# Patient Record
Sex: Female | Born: 1950 | Race: White | Hispanic: No | Marital: Single | Smoking: Never smoker
Health system: Southern US, Community
[De-identification: ages and names within clinical notes are randomized; demographics above are authoritative.]

## PROBLEM LIST (undated history)

## (undated) DIAGNOSIS — F419 Anxiety disorder, unspecified: Secondary | ICD-10-CM

## (undated) DIAGNOSIS — G43909 Migraine, unspecified, not intractable, without status migrainosus: Secondary | ICD-10-CM

## (undated) DIAGNOSIS — F32A Depression, unspecified: Secondary | ICD-10-CM

## (undated) DIAGNOSIS — F329 Major depressive disorder, single episode, unspecified: Secondary | ICD-10-CM

## (undated) DIAGNOSIS — K649 Unspecified hemorrhoids: Secondary | ICD-10-CM

## (undated) DIAGNOSIS — Z8781 Personal history of (healed) traumatic fracture: Secondary | ICD-10-CM

## (undated) DIAGNOSIS — T560X1A Toxic effect of lead and its compounds, accidental (unintentional), initial encounter: Secondary | ICD-10-CM

## (undated) DIAGNOSIS — Z8739 Personal history of other diseases of the musculoskeletal system and connective tissue: Secondary | ICD-10-CM

## (undated) DIAGNOSIS — D649 Anemia, unspecified: Secondary | ICD-10-CM

## (undated) HISTORY — DX: Depression, unspecified: F32.A

## (undated) HISTORY — DX: Anxiety disorder, unspecified: F41.9

## (undated) HISTORY — DX: Personal history of (healed) traumatic fracture: Z87.81

## (undated) HISTORY — DX: Migraine, unspecified, not intractable, without status migrainosus: G43.909

## (undated) HISTORY — DX: Personal history of other diseases of the musculoskeletal system and connective tissue: Z87.39

## (undated) HISTORY — DX: Unspecified hemorrhoids: K64.9

## (undated) HISTORY — DX: Major depressive disorder, single episode, unspecified: F32.9

---

## 2017-03-27 ENCOUNTER — Encounter: Payer: Self-pay | Admitting: *Deleted

## 2017-03-27 ENCOUNTER — Observation Stay
Admission: EM | Admit: 2017-03-27 | Discharge: 2017-03-28 | Disposition: A | Discharge status: Home / Self Care | Payer: Medicare Other | Attending: Internal Medicine | Admitting: Internal Medicine

## 2017-03-27 ENCOUNTER — Observation Stay: Payer: Medicare Other

## 2017-03-27 DIAGNOSIS — K5730 Diverticulosis of large intestine without perforation or abscess without bleeding: Secondary | ICD-10-CM | POA: Diagnosis not present

## 2017-03-27 DIAGNOSIS — Z77011 Contact with and (suspected) exposure to lead: Secondary | ICD-10-CM

## 2017-03-27 DIAGNOSIS — D649 Anemia, unspecified: Secondary | ICD-10-CM | POA: Diagnosis present

## 2017-03-27 DIAGNOSIS — E538 Deficiency of other specified B group vitamins: Secondary | ICD-10-CM | POA: Diagnosis not present

## 2017-03-27 DIAGNOSIS — J189 Pneumonia, unspecified organism: Secondary | ICD-10-CM | POA: Diagnosis not present

## 2017-03-27 DIAGNOSIS — R112 Nausea with vomiting, unspecified: Secondary | ICD-10-CM | POA: Diagnosis not present

## 2017-03-27 DIAGNOSIS — Z79899 Other long term (current) drug therapy: Secondary | ICD-10-CM | POA: Diagnosis not present

## 2017-03-27 DIAGNOSIS — M7989 Other specified soft tissue disorders: Secondary | ICD-10-CM

## 2017-03-27 DIAGNOSIS — R079 Chest pain, unspecified: Secondary | ICD-10-CM | POA: Diagnosis not present

## 2017-03-27 DIAGNOSIS — R0602 Shortness of breath: Secondary | ICD-10-CM | POA: Insufficient documentation

## 2017-03-27 DIAGNOSIS — D509 Iron deficiency anemia, unspecified: Secondary | ICD-10-CM | POA: Diagnosis not present

## 2017-03-27 HISTORY — DX: Anemia, unspecified: D64.9

## 2017-03-27 HISTORY — DX: Toxic effect of lead and its compounds, accidental (unintentional), initial encounter: T56.0X1A

## 2017-03-27 LAB — CBC
HEMATOCRIT: 19.9 % — AB (ref 35.0–47.0)
Hemoglobin: 5.7 g/dL — ABNORMAL LOW (ref 12.0–16.0)
MCH: 15.7 pg — ABNORMAL LOW (ref 26.0–34.0)
MCHC: 28.5 g/dL — ABNORMAL LOW (ref 32.0–36.0)
MCV: 55 fL — ABNORMAL LOW (ref 80.0–100.0)
Platelets: 462 10*3/uL — ABNORMAL HIGH (ref 150–440)
RBC: 3.62 MIL/uL — AB (ref 3.80–5.20)
RDW: 21.1 % — ABNORMAL HIGH (ref 11.5–14.5)
WBC: 3.4 10*3/uL — AB (ref 3.6–11.0)

## 2017-03-27 LAB — COMPREHENSIVE METABOLIC PANEL
ALK PHOS: 73 U/L (ref 38–126)
ALT: 19 U/L (ref 14–54)
AST: 32 U/L (ref 15–41)
Albumin: 3.5 g/dL (ref 3.5–5.0)
Anion gap: 5 (ref 5–15)
BILIRUBIN TOTAL: 0.5 mg/dL (ref 0.3–1.2)
BUN: 14 mg/dL (ref 6–20)
CALCIUM: 8.6 mg/dL — AB (ref 8.9–10.3)
CO2: 22 mmol/L (ref 22–32)
CREATININE: 0.67 mg/dL (ref 0.44–1.00)
Chloride: 109 mmol/L (ref 101–111)
GFR calc Af Amer: >60 mL/min (ref >60)
Glucose, Bld: 90 mg/dL (ref 65–99)
Potassium: 3.6 mmol/L (ref 3.5–5.1)
Sodium: 136 mmol/L (ref 135–145)
TOTAL PROTEIN: 6.6 g/dL (ref 6.5–8.1)

## 2017-03-27 LAB — RETICULOCYTES
RBC.: 3.59 MIL/uL — ABNORMAL LOW (ref 3.80–5.20)
Retic Count, Absolute: 53.9 10*3/uL (ref 19.0–183.0)
Retic Ct Pct: 1.5 % (ref 0.4–3.1)

## 2017-03-27 LAB — IRON AND TIBC
Iron: 9 ug/dL — ABNORMAL LOW (ref 28–170)
Saturation Ratios: 2 % — ABNORMAL LOW (ref 10.4–31.8)
TIBC: 481 ug/dL — ABNORMAL HIGH (ref 250–450)
UIBC: 473 ug/dL

## 2017-03-27 LAB — URINALYSIS, DIPSTICK ONLY
Bilirubin Urine: NEGATIVE
Glucose, UA: NEGATIVE mg/dL
Hgb urine dipstick: NEGATIVE
Ketones, ur: NEGATIVE mg/dL
LEUKOCYTES UA: NEGATIVE
Nitrite: NEGATIVE
Protein, ur: NEGATIVE mg/dL
Specific Gravity, Urine: 1.017 (ref 1.005–1.030)
pH: 6 (ref 5.0–8.0)

## 2017-03-27 LAB — APTT: aPTT: 30 seconds (ref 24–36)

## 2017-03-27 LAB — PREPARE RBC (CROSSMATCH)

## 2017-03-27 LAB — TROPONIN I

## 2017-03-27 LAB — FERRITIN: FERRITIN: 3 ng/mL — AB (ref 11–307)

## 2017-03-27 LAB — FOLATE: Folate: 18.7 ng/mL (ref >5.9)

## 2017-03-27 LAB — ABO/RH: ABO/RH(D): AB POS

## 2017-03-27 LAB — HEMOGLOBIN AND HEMATOCRIT, BLOOD
HEMATOCRIT: 29.8 % — AB (ref 35.0–47.0)
HEMOGLOBIN: 9.2 g/dL — AB (ref 12.0–16.0)

## 2017-03-27 LAB — LACTATE DEHYDROGENASE: LDH: 150 U/L (ref 98–192)

## 2017-03-27 LAB — PROTIME-INR
INR: 1.05
PROTHROMBIN TIME: 13.7 s (ref 11.4–15.2)

## 2017-03-27 LAB — VITAMIN B12: Vitamin B-12: 241 pg/mL (ref 180–914)

## 2017-03-27 MED ORDER — DOCUSATE SODIUM 100 MG PO CAPS: 100.0000 mg | ORAL_CAPSULE | Freq: Two times a day (BID) | ORAL | Status: DC | PRN

## 2017-03-27 MED ORDER — AMOXICILLIN-POT CLAVULANATE 875-125 MG PO TABS
1.0000 | ORAL_TABLET | Freq: Two times a day (BID) | ORAL | Status: DC
  Administered 2017-03-27 – 2017-03-28 (×2): 1 via ORAL
  Filled 2017-03-27 (×2): qty 1

## 2017-03-27 MED ORDER — HEPARIN SODIUM (PORCINE) 5000 UNIT/ML IJ SOLN
5000.0000 [IU] | Freq: Three times a day (TID) | INTRAMUSCULAR | Status: DC
  Filled 2017-03-27: qty 1

## 2017-03-27 MED ORDER — BENZONATATE 100 MG PO CAPS
100.0000 mg | ORAL_CAPSULE | Freq: Three times a day (TID) | ORAL | Status: DC | PRN
  Administered 2017-03-27: 100 mg via ORAL

## 2017-03-27 MED ORDER — IOPAMIDOL (ISOVUE-300) INJECTION 61%
100.0000 mL | Freq: Once | INTRAVENOUS | Status: AC | PRN
  Administered 2017-03-28: 100 mL via INTRAVENOUS

## 2017-03-27 MED ORDER — SODIUM CHLORIDE 0.9 % IV SOLN
10.0000 mL/h | Freq: Once | INTRAVENOUS | Status: AC
  Administered 2017-03-27: 10 mL/h via INTRAVENOUS

## 2017-03-27 MED ORDER — IOPAMIDOL (ISOVUE-300) INJECTION 61%
15.0000 mL | INTRAVENOUS | Status: AC
  Administered 2017-03-27 (×2): 15 mL via ORAL

## 2017-03-27 MED ORDER — AMOXICILLIN 500 MG PO CAPS
500.0000 mg | ORAL_CAPSULE | Freq: Two times a day (BID) | ORAL | Status: DC
  Filled 2017-03-27: qty 1

## 2017-03-27 MED ORDER — IBUPROFEN 400 MG PO TABS
400.0000 mg | ORAL_TABLET | Freq: Every day | ORAL | Status: DC | PRN
  Administered 2017-03-27: 400 mg via ORAL
  Filled 2017-03-27: qty 1

## 2017-03-27 NOTE — ED Provider Notes (Signed)
[facility] Emergency Department Provider Note   ____________________________________________    I have reviewed the triage vital signs and the nursing notes.   HISTORY  Chief Complaint Abnormal Lab     HPI Nils FlackMartha Barnhart is a 66 y.o. female who was sent in by her PCP for a low hemoglobin. Patient reports she had blood drawn several days ago and reports she was notified by her PCP that it was quite low, she really had a fingerstick recheck in the office and it was found to be 4.2. She reports shortness of breath with any exertion and occasional dizziness. She denies chest pain. No abdominal pain. No black stools. No history of GI bleeding. No blood thinners.   History reviewed. No pertinent past medical history.  There are no active problems to display for this patient.   History reviewed. No pertinent surgical history.  Prior to Admission medications   Not on File     Allergies Patient has no known allergies.  No family history on file.  Social History Social History  Substance Use Topics  . Smoking status: Never Smoker  . Smokeless tobacco: Never Used  . Alcohol use Not on file    Review of Systems  Constitutional: No fever/chills, Occasional dizziness Eyes: No visual changes.  ENT: No sore throat. Cardiovascular: Denies chest pain. Respiratory: As above Gastrointestinal: No abdominal pain.  No nausea, no vomiting.   Genitourinary: Negative for dysuria. Musculoskeletal: Negative for back pain. Skin: Negative for rash. Neurological: Negative for headaches    ____________________________________________   PHYSICAL EXAM:  VITAL SIGNS: ED Triage Vitals  Enc Vitals Group     BP 03/27/17 0821 (!) 138/91     Pulse Rate 03/27/17 0821 (!) 117     Resp 03/27/17 0821 18     Temp 03/27/17 0821 98.5 F (36.9 C)     Temp Source 03/27/17 0821 Oral     SpO2 03/27/17 0821 100 %     Weight 03/27/17 0821 59 kg (130 lb)     Height  03/27/17 0821 1.499 m (4\' 11" )     Head Circumference --      Peak Flow --      Pain Score 03/27/17 0853 5     Pain Loc --      Pain Edu? --      Excl. in GC? --     Constitutional: Alert and oriented. No acute distress. Pleasant and interactive Eyes: Conjunctivae are normal. Some conjunctival Pallor noted  Nose: No congestion/rhinnorhea. Mouth/Throat: Mucous membranes are moist.   Neck:  Painless ROM Cardiovascular: Tachycardia, regular rhythm. Grossly normal heart sounds.  Good peripheral circulation. Respiratory: Normal respiratory effort.  No retractions. Lungs CTAB. Gastrointestinal: Soft and nontender. No distention.  No CVA tenderness. Guaiac negative brown stool Genitourinary: deferred Musculoskeletal: No lower extremity tenderness nor edema.  Warm and well perfused Neurologic:  Normal speech and language. No gross focal neurologic deficits are appreciated.  Skin:  Skin is warm, dry and intact. Pallor noted Psychiatric: Mood and affect are normal. Speech and behavior are normal.  ____________________________________________   LABS (all labs ordered are listed, but only abnormal results are displayed)  Labs Reviewed  CBC - Abnormal; Notable for the following:       Result Value   WBC 3.4 (*)    RBC 3.62 (*)    Hemoglobin 5.7 (*)    HCT 19.9 (*)    MCV 55.0 (*)    MCH 15.7 (*)  MCHC 28.5 (*)    RDW 21.1 (*)    Platelets 462 (*)    All other components within normal limits  COMPREHENSIVE METABOLIC PANEL - Abnormal; Notable for the following:    Calcium 8.6 (*)    All other components within normal limits  PROTIME-INR  APTT  TROPONIN I  TYPE AND SCREEN  PREPARE RBC (CROSSMATCH)  ABO/RH   ____________________________________________  EKG  None ____________________________________________  RADIOLOGY  None ____________________________________________   PROCEDURES  Procedure(s) performed: No    Critical Care performed:  No ____________________________________________   INITIAL IMPRESSION / ASSESSMENT AND PLAN / ED COURSE  Pertinent labs & imaging results that were available during my care of the patient were reviewed by me and considered in my medical decision making (see chart for details).  Patient presents with shortness of breath with exertion, pallor and reported low hemoglobin . Hemoglobin here is 5.7, guaiac negative brown stool. Quite low MCV, patient reports she has been on iron supplements in the past but stopped about 7-8 months ago. I suspect this is severe iron deficiency anemia, given her symptoms she will require admission ____________________________________________   FINAL CLINICAL IMPRESSION(S) / ED DIAGNOSES  Final diagnoses:  Symptomatic anemia      NEW MEDICATIONS STARTED DURING THIS VISIT:  New Prescriptions   No medications on file     Note:  This document was prepared using Dragon voice recognition software and may include unintentional dictation errors.    Jene Every, MD 03/27/17 1027

## 2017-03-27 NOTE — H&P (Addendum)
[employer] [facility] -  at [facility]   PATIENT NAME: Sonya Baker    MR#:  [id number]  DATE OF BIRTH:  [birth year]  DATE OF ADMISSION:  03/27/2017  PRIMARY CARE PHYSICIAN: Lovey Newcomer, MD   REQUESTING/REFERRING PHYSICIAN: [email or web address]  CHIEF COMPLAINT:   Chief Complaint  Patient presents with  . Abnormal Lab    HISTORY OF PRESENT ILLNESS: Sonya Baker  is a 66 y.o. female with a known history of Lead poisoning and iron deficiency anemia, stopped taking vitamins and iron supplements since December 2017. For last 3-4 weeks is feeling more fatigue. 2 days ago she went to walk-in clinic and her doctor did the workup including blood work and chest x-ray for her complain of some cough and suggested that she has pneumonia and gave her some antibiotics. Her blood work reported today that she has severe anemia and so her doctor's office called and told her to go to emergency room to get some blood transfusion. Patient denies noticing any bleeding or dark stool, in ER stool guaiac was reported negative for any blood. ER physician ordered 2 units of blood transfusions.  PAST MEDICAL HISTORY:   Past Medical History:  Diagnosis Date  . Anemia   . Lead poisoning     PAST SURGICAL HISTORY: History reviewed. No pertinent surgical history.  SOCIAL HISTORY:  Social History  Substance Use Topics  . Smoking status: Never Smoker  . Smokeless tobacco: Never Used  . Alcohol use No    FAMILY HISTORY:  Family History  Problem Relation Age of Onset  . Heart failure Father     DRUG ALLERGIES: No Known Allergies  REVIEW OF SYSTEMS:   CONSTITUTIONAL: No fever,Positive for fatigue or weakness.  EYES: No blurred or double vision.  EARS, NOSE, AND THROAT: No tinnitus or ear pain.  RESPIRATORY: No cough, shortness of breath, wheezing or hemoptysis.  CARDIOVASCULAR: No chest pain, orthopnea, edema.  GASTROINTESTINAL: No nausea, vomiting, diarrhea or abdominal pain.   GENITOURINARY: No dysuria, hematuria.  ENDOCRINE: No polyuria, nocturia,  HEMATOLOGY: No anemia, easy bruising or bleeding SKIN: No rash or lesion. MUSCULOSKELETAL: No joint pain or arthritis.   NEUROLOGIC: No tingling, numbness, weakness.  PSYCHIATRY: No anxiety or depression.   MEDICATIONS AT HOME:  Prior to Admission medications   Medication Sig Start Date End Date Taking? Authorizing Provider  amoxicillin (AMOXIL) 500 MG tablet Take 500 mg by mouth 2 (two) times daily. 03/22/17 03/31/17 Yes [provider]  benzonatate (TESSALON) 100 MG capsule Take 100 mg by mouth 3 (three) times daily as needed for cough.   Yes [provider]  ibuprofen (ADVIL,MOTRIN) 200 MG tablet Take 200-800 mg by mouth daily as needed for headache.   Yes [provider]      PHYSICAL EXAMINATION:   VITAL SIGNS: Blood pressure (!) 152/105, pulse (!) 108, temperature 98.5 F (36.9 C), temperature source Oral, resp. rate (!) 23, height 4\' 11"  (1.499 m), weight 59 kg (130 lb), SpO2 100 %.  GENERAL:  66 y.o.-year-old patient lying in the bed with no acute distress.  EYES: Pupils equal, round, reactive to light and accommodation. No scleral icterus. Extraocular muscles intact.Conjunctiva pale.  HEENT: Head atraumatic, normocephalic. Oropharynx and nasopharynx clear.  NECK:  Supple, no jugular venous distention. No thyroid enlargement, no tenderness.  LUNGS: Normal breath sounds bilaterally, no wheezing, rales,rhonchi or crepitation. No use of accessory muscles of respiration.  CARDIOVASCULAR: S1, S2 normal. No murmurs, rubs, or gallops.  ABDOMEN: Soft, nontender, nondistended.  Bowel sounds present. No organomegaly or mass.  EXTREMITIES: No pedal edema, cyanosis, or clubbing.  NEUROLOGIC: Cranial nerves II through XII are intact. Muscle strength 5/5 in all extremities. Sensation intact. Gait not checked.  PSYCHIATRIC: The patient is alert and oriented x 3.  SKIN: No obvious rash, lesion,  or ulcer.   LABORATORY PANEL:   CBC  Recent Labs Lab 03/27/17 0835  WBC 3.4*  HGB 5.7*  HCT 19.9*  PLT 462*  MCV 55.0*  MCH 15.7*  MCHC 28.5*  RDW 21.1*   ------------------------------------------------------------------------------------------------------------------  Chemistries   Recent Labs Lab 03/27/17 0835  NA 136  K 3.6  CL 109  CO2 22  GLUCOSE 90  BUN 14  CREATININE 0.67  CALCIUM 8.6*  AST 32  ALT 19  ALKPHOS 73  BILITOT 0.5   ------------------------------------------------------------------------------------------------------------------ estimated creatinine clearance is 54.8 mL/min (by C-G formula based on SCr of 0.67 mg/dL). ------------------------------------------------------------------------------------------------------------------ No results for input(s): TSH, T4TOTAL, T3FREE, THYROIDAB in the last 72 hours.  Invalid input(s): FREET3   Coagulation profile  Recent Labs Lab 03/27/17 0835  INR 1.05   ------------------------------------------------------------------------------------------------------------------- No results for input(s): DDIMER in the last 72 hours. -------------------------------------------------------------------------------------------------------------------  Cardiac Enzymes  Recent Labs Lab 03/27/17 0835  TROPONINI <0.03   ------------------------------------------------------------------------------------------------------------------ Invalid input(s): POCBNP  ---------------------------------------------------------------------------------------------------------------  Urinalysis No results found for: COLORURINE, APPEARANCEUR, LABSPEC, PHURINE, GLUCOSEU, HGBUR, BILIRUBINUR, KETONESUR, PROTEINUR, UROBILINOGEN, NITRITE, LEUKOCYTESUR   RADIOLOGY: No results found.  EKG: Orders placed or performed during the hospital encounter of 03/27/17  . ED EKG  . ED EKG    IMPRESSION AND PLAN:  *  Symptomatic anemia likely iron deficiency anemia   Patient has history of lead poisoning in past.    I spoke to hematologist on call and suggested the workup to check for iron studies, haptoglobin, LDH, reticulocyte count, folic acid level, vitamin B12 level.   2 units of blood transfusion is ordered by ER physician.  * History of lead poisoning   I will suggest to refer to hematologist on discharge.  * Tachycardia   Likely secondary to anemia.  * pneumonia   She was started on amoxicilline as out pt, feeling better, cont for 3-4 more days.  All the records are reviewed and case discussed with ED provider. Management plans discussed with the patient, family and they are in agreement.  CODE STATUS: Full code. Code Status History    This patient does not have a recorded code status. Please follow your organizational policy for patients in this situation.     Patient's daughter was present in the room during my visit.  TOTAL TIME TAKING CARE OF THIS PATIENT: 50 minutes.    Altamese DillingVACHHANI, Gal Smolinski M.D on 03/27/2017   Between 7am to 6pm - Pager - [phone]  After 6pm go to [email or web address] - password [email or web address]  [facility]  Office  [phone]  CC: Primary care physician; Lovey NewcomerWroth, Shelley Wilson, MD   Note: This dictation was prepared with Dragon dictation along with smaller phrase technology. Any transcriptional errors that result from this process are unintentional.

## 2017-03-27 NOTE — ED Triage Notes (Signed)
Pt reports [facility] and having labs drawn, was called and told to come to ED due to hemoglobin being 4.2.

## 2017-03-27 NOTE — ED Triage Notes (Signed)
Pt states she was called from PCP and told she had low hgb, denies any blood in her stool, states she is currently being tx for peneumonia as well

## 2017-03-27 NOTE — Consult Note (Signed)
[facility] CONSULT NOTE  Patient Care Team: Wroth, Alvia Grove, MD as PCP - General (Obstetrics and Gynecology)  CHIEF COMPLAINTS/PURPOSE OF CONSULTATION: Severe anemia  HISTORY OF PRESENTING ILLNESS:  Sonya Baker 66 y.o.  female with no significant medical problems except previous history of lead poisoning- is currently admitted to the hospital for severe anemia.  Patient stated that she noted to have shortness of breath; left chest wall pain with cough and low-grade fever over the last week or so. She was evaluated in the urgent care Center- chest x-ray showed pneumonia. Patient was started on antibiotics. Patient also had labs drawn that showed severe anemia with hemoglobin of 4.7- 5.4. Then she was directed to the hospital for further workup and PRBC transfusion.  Patient admits to have intermittent nausea with vomiting; however denies any current weight loss. Denies any dysphagia. Appetite is stable. Interestingly she denies any chronic shortness of breath. She has intermittent swelling in the legs for which she takes Lasix on an intermittent basis. Denies any blood in stools or black stools. Denies any blood in urine.  Interestingly patient has a history of lead poisoning while scraping of the lead based pain of the wall in 2009. As the patient that she was told to have anemia for which she had been taking iron and B12 supplementation for many years. She stopped approximately a year ago. Patient never had colonoscopy. He is having EGD.  ROS: A complete 10 point review of system is done which is negative except mentioned above in history of present illness  MEDICAL HISTORY:  Past Medical History:  Diagnosis Date  . Anemia   . Lead poisoning     SURGICAL HISTORY: Some kind of bowel surgery at the age of 41; for bowel obstruction  History reviewed. No pertinent surgical history.  SOCIAL HISTORY: Patient works in a factory. She lives with her daughter. She is  independent. No smoking or alcohol. Social History   Social History  . Marital status: Single    Spouse name: N/A  . Number of children: N/A  . Years of education: N/A   Occupational History  . Not on file.   Social History Main Topics  . Smoking status: Never Smoker  . Smokeless tobacco: Never Used  . Alcohol use No  . Drug use: No  . Sexual activity: Not on file   Other Topics Concern  . Not on file   Social History Narrative  . No narrative on file    FAMILY HISTORY:Denies any family history of cancers. Family History  Problem Relation Age of Onset  . Heart failure Father     ALLERGIES:  has No Known Allergies.  MEDICATIONS:  Current Facility-Administered Medications  Medication Dose Route Frequency Provider Last Rate Last Dose  . amoxicillin-clavulanate (AUGMENTIN) 875-125 MG per tablet 1 tablet  1 tablet Oral Q12H Altamese Dilling, MD   1 tablet at 03/27/17 1858  . benzonatate (TESSALON) capsule 100 mg  100 mg Oral TID PRN Altamese Dilling, MD   100 mg at 03/27/17 1653  . docusate sodium (COLACE) capsule 100 mg  100 mg Oral BID PRN Altamese Dilling, MD      . heparin injection 5,000 Units  5,000 Units Subcutaneous Q8H Altamese Dilling, MD      . ibuprofen (ADVIL,MOTRIN) tablet 400 mg  400 mg Oral Daily PRN Altamese Dilling, MD          .  PHYSICAL EXAMINATION:  Vitals:   03/27/17 1645 03/27/17 1658  BP: 138/79 (!) 150/96  Pulse: (!) 104 (!) 110  Resp: 18 18  Temp: 99 F (37.2 C) 99 F (37.2 C)   Filed Weights   03/27/17 0821 03/27/17 1459  Weight: 130 lb (59 kg) 132 lb 8 oz (60.1 kg)    GENERAL: Well-nourished well-developed; Alert, no distress and comfortable.   Accompanied by daughter. EYES: Positive for pallor nor icterus. OROPHARYNX: no thrush or ulceration. NECK: supple, no masses felt LYMPH:  no palpable lymphadenopathy in the cervical, axillary or inguinal regions LUNGS: decreased breath sounds to  auscultation at bases and  No wheeze or crackles HEART/CVS: regular rate & rhythm and no murmurs; No lower extremity edema ABDOMEN: abdomen soft, non-tender and normal bowel sounds Musculoskeletal:no cyanosis of digits and no clubbing  PSYCH: alert & oriented x 3 with fluent speech NEURO: no focal motor/sensory deficits SKIN:  no rashes or significant lesions  LABORATORY DATA:  I have reviewed the data as listed Lab Results  Component Value Date   WBC 3.4 (L) 03/27/2017   HGB 5.7 (L) 03/27/2017   HCT 19.9 (L) 03/27/2017   MCV 55.0 (L) 03/27/2017   PLT 462 (H) 03/27/2017    Recent Labs  03/27/17 0835  NA 136  K 3.6  CL 109  CO2 22  GLUCOSE 90  BUN 14  CREATININE 0.67  CALCIUM 8.6*  GFRNONAA >60  GFRAA >60  PROT 6.6  ALBUMIN 3.5  AST 32  ALT 19  ALKPHOS 73  BILITOT 0.5    RADIOGRAPHIC STUDIES: I have personally reviewed the radiological images as listed and agreed with the findings in the report. No results found.  ASSESSMENT & PLAN:   # 66 year old female patient currently admitted the hospital for severe microcytic anemia  # Severe microcytic anemia hemoglobin 4.7-5. Iron saturation 2% ferritin 3. Patient receiving blood transfusion at this time. Patient will also benefit from IV iron transfusions.  # Etiology of iron deficiency anemia is unclear- chronic blood loss versus malabsorption. Check stool occult 2; UA. I also recommend CT of the chest abdomen pelvis with contrast to rule out any malignant causes for the severe anemia. LDH normal; haptoglobin pending. Also discussed the need for a EGD colonoscopy based upon above workup an outpatient basis.  # History of lead poisoning- I do not suspect this to be a factor in current severe iron deficiency anemia.   Thank you Dr. Elisabeth PigeonVachhani for allowing me to participate in the care of your pleasant patient. Please do not hesitate to contact me with questions or concerns in the interim. Discussed with Dr.Vachhani. The  above plan of care was discussed with the patient and daughter in detail.  All questions were answered. The patient knows to call the clinic with any problems, questions or concerns.    Earna CoderGovinda R Sakari Raisanen, MD 03/27/2017 7:29 PM

## 2017-03-28 LAB — BASIC METABOLIC PANEL
Anion gap: 5 (ref 5–15)
BUN: 17 mg/dL (ref 6–20)
CHLORIDE: 108 mmol/L (ref 101–111)
CO2: 24 mmol/L (ref 22–32)
Calcium: 8.5 mg/dL — ABNORMAL LOW (ref 8.9–10.3)
Creatinine, Ser: 0.84 mg/dL (ref 0.44–1.00)
GFR calc Af Amer: >60 mL/min (ref >60)
GFR calc non Af Amer: >60 mL/min (ref >60)
Glucose, Bld: 88 mg/dL (ref 65–99)
POTASSIUM: 3.6 mmol/L (ref 3.5–5.1)
Sodium: 137 mmol/L (ref 135–145)

## 2017-03-28 LAB — BPAM RBC
BLOOD PRODUCT EXPIRATION DATE: 201807112359
BLOOD PRODUCT EXPIRATION DATE: 201807112359
ISSUE DATE / TIME: 201806271123
ISSUE DATE / TIME: 201806271625
UNIT TYPE AND RH: 5100
Unit Type and Rh: 5100

## 2017-03-28 LAB — CBC
HEMATOCRIT: 26.9 % — AB (ref 35.0–47.0)
Hemoglobin: 8.6 g/dL — ABNORMAL LOW (ref 12.0–16.0)
MCH: 19.8 pg — ABNORMAL LOW (ref 26.0–34.0)
MCHC: 32.1 g/dL (ref 32.0–36.0)
MCV: 61.8 fL — AB (ref 80.0–100.0)
Platelets: 375 10*3/uL (ref 150–440)
RBC: 4.36 MIL/uL (ref 3.80–5.20)
RDW: 30.2 % — AB (ref 11.5–14.5)
WBC: 6.2 10*3/uL (ref 3.6–11.0)

## 2017-03-28 LAB — TYPE AND SCREEN
ABO/RH(D): AB POS
ANTIBODY SCREEN: NEGATIVE
UNIT DIVISION: 0
Unit division: 0

## 2017-03-28 LAB — HAPTOGLOBIN: Haptoglobin: 68 mg/dL (ref 34–200)

## 2017-03-28 LAB — PATHOLOGIST SMEAR REVIEW

## 2017-03-28 MED ORDER — SODIUM CHLORIDE 0.9 % IV SOLN
150.0000 mg | Freq: Once | INTRAVENOUS | Status: AC
  Administered 2017-03-28: 150 mg via INTRAVENOUS
  Filled 2017-03-28: qty 7.5

## 2017-03-28 MED ORDER — FERROUS SULFATE 325 (65 FE) MG PO TABS: 325.0000 mg | ORAL_TABLET | Freq: Every day | ORAL | 3 refills | Status: AC

## 2017-03-28 NOTE — Progress Notes (Signed)
Discharge instructions along with home medications and follow up gone over with patient and daughter. Both verbalize that they understood instructions. No prescriptions given to patient. IV removed. Pt being discharged home on room air, no distress noted. Issachar Broady S Fenton, RN 

## 2017-03-28 NOTE — Care Management Obs Status (Signed)
MEDICARE OBSERVATION STATUS NOTIFICATION   Patient Details  Name: Sonya Baker MRN: [id number] Date of Birth: 12/07/1950   Medicare Observation Status Notification Given:  Yes    Gwenette GreetBrenda S Clarkson Rosselli, RN 03/28/2017, 9:03 AM

## 2017-03-28 NOTE — Discharge Summary (Signed)
Nils FlackMartha Jahr, is a 66 y.o. female  DOB 08/16/1951  MRN [id number].  Admission date:  03/27/2017  Admitting Physician  Altamese DillingVaibhavkumar Vachhani, MD  Discharge Date:  03/28/2017   Primary MD  Lovey NewcomerWroth, Shelley Wilson, MD  Recommendations for primary care physician for things to follow:  Discharge  home, follow-up with Dr. Donneta RombergBrahmanday  in 2 weeks Made the appointment to follow up with Dr. Wyline MoodKiran Anna  for EGD and colonoscopy.   Admission Diagnosis  Symptomatic anemia [D64.9]   Discharge Diagnosis  Symptomatic anemia [D64.9]    Principal Problem:   Symptomatic anemia      Past Medical History:  Diagnosis Date  . Anemia   . Lead poisoning     History reviewed. No pertinent surgical history.     History of present illness and  Hospital Course:     Kindly see H&P for history of present illness and admission details, please review complete Labs, Consult reports and Test reports for all details in brief  HPI  from the history and physical done on the day of admission 66 year old female patient with history of recent pneumonia on antibiotics had routine labs done. Found to have hemoglobin of 5.7. So she was sent to emergency room for transfusion and workup for anemia.   Hospital Course   1 . Severe  microcytic anemia hemoglobin 5.7 on admission. Received 2 units of packed RBC. Hemoglobin improved to 5.7-9.2. Iron studies showed severe iron deficiency anemia with iron level of 9, saturation is 2%. 18.7. Ferritin is 3. B12 241. Seen by oncology. Patient did have chest CAT scan and abdominal CAT scan which are not diagnostic. Patient is stable for discharge, patient needs outpatient EGD, colonoscopy. We are giving her on 50 mg of IV Venofer and discharging her home with ferrous sulfate.    Discharge Condition: stable   Follow  UP  Follow-up Information    Wroth, Alvia GroveShelley Wilson, MD. Schedule an appointment as soon as possible for a visit in 1 week.   Specialties:  Obstetrics and Gynecology Contact information: [address] [place] [address] [phone]        Wyline MoodAnna, Kiran, MD. Go on 04/11/2017.   Specialty:  Surgery Why:  @1 :15 PM // needs egd/colonosocpy for  iron deficinecy anemia Contact information: [address] [place] [address] [phone]        Earna CoderBrahmanday, Govinda R, MD. Go on 04/09/2017.   Specialties:  Internal Medicine, Oncology Why:  @10 :00 AM Contact information: [address] [place] [address] [phone]             Discharge Instructions  and  Discharge Medications      Allergies as of 03/28/2017   No Known Allergies     Medication List    STOP taking these medications   ibuprofen 200 MG tablet Commonly known as:  ADVIL,MOTRIN     TAKE these medications   amoxicillin-clavulanate 875-125 MG tablet Commonly known as:  AUGMENTIN Take 1 tablet by mouth 2 (two) times daily.   benzonatate 100 MG capsule Commonly known as:  TESSALON Take 100 mg by mouth 3 (three) times daily as needed for cough.   ferrous sulfate 325 (65 FE) MG tablet Take 1 tablet (325 mg total) by mouth daily.         Diet and Activity recommendation: See Discharge Instructions above   Consults obtained - hem onc   Major procedures and Radiology Reports - PLEASE review detailed and final reports  for all details, in brief -      Ct Chest W Contrast  Result Date: 03/28/2017 CLINICAL DATA:  66 year old female with history of shortness of breath and left-sided chest wall pain with cough and low-grade fever over the last week. Possible pneumonia noted on recent chest x-ray. Currently on antibiotics. Severe anemia. Intermittent nausea and vomiting. EXAM: CT CHEST, ABDOMEN, AND PELVIS WITH CONTRAST TECHNIQUE: Multidetector CT imaging of the chest, abdomen  and pelvis was performed following the standard protocol during bolus administration of intravenous contrast. CONTRAST:  ISOVUE-300 IOPAMIDOL (ISOVUE-300) INJECTION 61% COMPARISON:  None. FINDINGS: CT CHEST FINDINGS Cardiovascular: Heart size is mildly enlarged. There is no significant pericardial fluid, thickening or pericardial calcification. There is aortic atherosclerosis, as well as atherosclerosis of the great vessels of the mediastinum and the coronary arteries, including calcified atherosclerotic plaque in the right coronary artery. Mediastinum/Nodes: No pathologically enlarged mediastinal or hilar lymph nodes. Moderate-sized hiatal hernia. No axillary lymphadenopathy. Lungs/Pleura: Moderate right and small left pleural effusions lying dependently. Dependent areas of subsegmental atelectasis are noted in the lower lobes of the lungs bilaterally (right greater than left). No definite consolidative airspace disease. Diffuse bronchial wall thickening with diffuse interlobular septal thickening, suggesting a background of mild interstitial pulmonary edema. No definite suspicious appearing pulmonary nodules or masses are noted. Musculoskeletal: There are no aggressive appearing lytic or blastic lesions noted in the visualized portions of the skeleton. CT ABDOMEN PELVIS FINDINGS Hepatobiliary: Small capsular calcification overlying segment 4A of the liver is likely related to remote trauma. No suspicious cystic or solid hepatic lesions are noted. No intra or extrahepatic biliary ductal dilatation. Gallbladder is normal in appearance. Pancreas: No pancreatic mass. No pancreatic ductal dilatation. No pancreatic or peripancreatic fluid or inflammatory changes. Spleen: Unremarkable. Adrenals/Urinary Tract: 16 mm simple cyst in the lower pole of the left kidney. Subcentimeter lesion in the interpolar region of the left kidney is too small to definitively characterize, but is statistically likely to represent a  cyst. Right kidney and bilateral adrenal glands are normal in appearance. There is no hydroureteronephrosis. Urinary bladder is normal in appearance. Stomach/Bowel: The appearance of the intraabdominal portion of the stomach is normal. There is no pathologic dilatation of small bowel or colon. A few scattered colonic diverticulae are noted, without surrounding inflammatory changes to suggest an acute diverticulitis at this time. The appendix is not confidently identified and may be surgically absent. Regardless, there are no inflammatory changes noted adjacent to the cecum to suggest the presence of an acute appendicitis at this time. Vascular/Lymphatic: Aortic atherosclerosis, without evidence of aneurysm or dissection in the abdominal or pelvic vasculature. No lymphadenopathy noted in the abdomen or pelvis. Reproductive: Uterus and ovaries are unremarkable in appearance. Other: No significant volume of ascites.  No pneumoperitoneum. Musculoskeletal: Old compression fracture of superior endplate of L4 with approximately 20% loss of anterior vertebral body height. There are no aggressive appearing lytic or blastic lesions noted in the visualized portions of the skeleton. IMPRESSION: 1. The appearance of the chest suggests congestive heart failure, as discussed above. 2. No airspace consolidation identified at this time to suggest underlying pneumonia. 3. Aortic atherosclerosis, in addition to right coronary artery disease. Please note that although the presence of coronary artery calcium documents the presence of coronary artery disease, the severity of this disease and any potential stenosis cannot be assessed on this non-gated CT examination. Assessment for potential risk factor modification, dietary therapy or pharmacologic therapy may be warranted, if clinically indicated. 4. Mild  colonic diverticulosis without evidence to suggest an acute diverticulitis at this time. 5. Additional incidental findings, as above.  Electronically Signed   By: Trudie Reed M.D.   On: 03/28/2017 08:35   Ct Abdomen Pelvis W Contrast  Result Date: 03/28/2017 CLINICAL DATA:  66 year old female with history of shortness of breath and left-sided chest wall pain with cough and low-grade fever over the last week. Possible pneumonia noted on recent chest x-ray. Currently on antibiotics. Severe anemia. Intermittent nausea and vomiting. EXAM: CT CHEST, ABDOMEN, AND PELVIS WITH CONTRAST TECHNIQUE: Multidetector CT imaging of the chest, abdomen and pelvis was performed following the standard protocol during bolus administration of intravenous contrast. CONTRAST:  ISOVUE-300 IOPAMIDOL (ISOVUE-300) INJECTION 61% COMPARISON:  None. FINDINGS: CT CHEST FINDINGS Cardiovascular: Heart size is mildly enlarged. There is no significant pericardial fluid, thickening or pericardial calcification. There is aortic atherosclerosis, as well as atherosclerosis of the great vessels of the mediastinum and the coronary arteries, including calcified atherosclerotic plaque in the right coronary artery. Mediastinum/Nodes: No pathologically enlarged mediastinal or hilar lymph nodes. Moderate-sized hiatal hernia. No axillary lymphadenopathy. Lungs/Pleura: Moderate right and small left pleural effusions lying dependently. Dependent areas of subsegmental atelectasis are noted in the lower lobes of the lungs bilaterally (right greater than left). No definite consolidative airspace disease. Diffuse bronchial wall thickening with diffuse interlobular septal thickening, suggesting a background of mild interstitial pulmonary edema. No definite suspicious appearing pulmonary nodules or masses are noted. Musculoskeletal: There are no aggressive appearing lytic or blastic lesions noted in the visualized portions of the skeleton. CT ABDOMEN PELVIS FINDINGS Hepatobiliary: Small capsular calcification overlying segment 4A of the liver is likely related to remote trauma. No  suspicious cystic or solid hepatic lesions are noted. No intra or extrahepatic biliary ductal dilatation. Gallbladder is normal in appearance. Pancreas: No pancreatic mass. No pancreatic ductal dilatation. No pancreatic or peripancreatic fluid or inflammatory changes. Spleen: Unremarkable. Adrenals/Urinary Tract: 16 mm simple cyst in the lower pole of the left kidney. Subcentimeter lesion in the interpolar region of the left kidney is too small to definitively characterize, but is statistically likely to represent a cyst. Right kidney and bilateral adrenal glands are normal in appearance. There is no hydroureteronephrosis. Urinary bladder is normal in appearance. Stomach/Bowel: The appearance of the intraabdominal portion of the stomach is normal. There is no pathologic dilatation of small bowel or colon. A few scattered colonic diverticulae are noted, without surrounding inflammatory changes to suggest an acute diverticulitis at this time. The appendix is not confidently identified and may be surgically absent. Regardless, there are no inflammatory changes noted adjacent to the cecum to suggest the presence of an acute appendicitis at this time. Vascular/Lymphatic: Aortic atherosclerosis, without evidence of aneurysm or dissection in the abdominal or pelvic vasculature. No lymphadenopathy noted in the abdomen or pelvis. Reproductive: Uterus and ovaries are unremarkable in appearance. Other: No significant volume of ascites.  No pneumoperitoneum. Musculoskeletal: Old compression fracture of superior endplate of L4 with approximately 20% loss of anterior vertebral body height. There are no aggressive appearing lytic or blastic lesions noted in the visualized portions of the skeleton. IMPRESSION: 1. The appearance of the chest suggests congestive heart failure, as discussed above. 2. No airspace consolidation identified at this time to suggest underlying pneumonia. 3. Aortic atherosclerosis, in addition to right  coronary artery disease. Please note that although the presence of coronary artery calcium documents the presence of coronary artery disease, the severity of this disease and any potential stenosis cannot be  assessed on this non-gated CT examination. Assessment for potential risk factor modification, dietary therapy or pharmacologic therapy may be warranted, if clinically indicated. 4. Mild colonic diverticulosis without evidence to suggest an acute diverticulitis at this time. 5. Additional incidental findings, as above. Electronically Signed   By: Trudie Reed M.D.   On: 03/28/2017 08:35    Micro Results     No results found for this or any previous visit (from the past 240 hour(s)).     Today   Subjective:   Ariannah Arenson today has no headache,no chest abdominal pain,no new weakness tingling or numbness, feels much better wants to go home today.   Objective:   Blood pressure (!) 144/76, pulse 85, temperature 97.9 F (36.6 C), temperature source Oral, resp. rate 18, height 4\' 11"  (1.499 m), weight 60.1 kg (132 lb 8 oz), SpO2 98 %.   Intake/Output Summary (Last 24 hours) at 03/28/17 1256 Last data filed at 03/27/17 1930  Gross per 24 hour  Intake           895.83 ml  Output                0 ml  Net           895.83 ml    Exam Awake Alert, Oriented x 3, No new F.N deficits, Normal affect [place].AT,PERRAL Supple Neck,No JVD, No cervical lymphadenopathy appriciated.  Symmetrical Chest wall movement, Good air movement bilaterally, CTAB RRR,No Gallops,Rubs or new Murmurs, No Parasternal Heave +ve B.Sounds, Abd Soft, Non tender, No organomegaly appriciated, No rebound -guarding or rigidity. No Cyanosis, Clubbing or edema, No new Rash or bruise  Data Review   CBC w Diff: Lab Results  Component Value Date   WBC 6.2 03/28/2017   HGB 8.6 (L) 03/28/2017   HCT 26.9 (L) 03/28/2017   PLT 375 03/28/2017    CMP: Lab Results  Component Value Date   NA 137 03/28/2017   K 3.6  03/28/2017   CL 108 03/28/2017   CO2 24 03/28/2017   BUN 17 03/28/2017   CREATININE 0.84 03/28/2017   PROT 6.6 03/27/2017   ALBUMIN 3.5 03/27/2017   BILITOT 0.5 03/27/2017   ALKPHOS 73 03/27/2017   AST 32 03/27/2017   ALT 19 03/27/2017  .   Total Time in preparing paper work, data evaluation and todays exam - 35 minutes  Alexys Lobello M.D on 03/28/2017 at 12:56 PM    Note: This dictation was prepared with Dragon dictation along with smaller phrase technology. Any transcriptional errors that result from this process are unintentional.

## 2017-03-28 NOTE — Care Management (Signed)
Admitted to this facility with the diagnosis of symptomatic  Anemia under observation status.  Lives with daughter, Quentin OreMartha, Daughter Danielle [phone]). [facility], Butler [facility]Wroth [facility] days ago. [facility]. No home Health. No skilled facility. No home oxygen. No medical equipment in the home. Takes care of all basic and instrumental activities of daily living herself, drives. No falls. Good appetite. Daughter will transport Gwenette GreetBrenda S Erinn Huskins RN MSN CCM Care Management [phone]

## 2017-03-29 LAB — HIV ANTIBODY (ROUTINE TESTING W REFLEX): HIV Screen 4th Generation wRfx: NONREACTIVE

## 2017-04-01 ENCOUNTER — Telehealth: Payer: Self-pay | Admitting: Internal Medicine

## 2017-04-01 ENCOUNTER — Other Ambulatory Visit: Payer: Self-pay | Admitting: Internal Medicine

## 2017-04-01 DIAGNOSIS — D500 Iron deficiency anemia secondary to blood loss (chronic): Secondary | ICD-10-CM

## 2017-04-01 NOTE — Telephone Encounter (Signed)
Please add cbc/venofer infusion at next visit [7/10 at [facility]]. Thx

## 2017-04-02 ENCOUNTER — Other Ambulatory Visit: Payer: Self-pay | Admitting: *Deleted

## 2017-04-02 DIAGNOSIS — D500 Iron deficiency anemia secondary to blood loss (chronic): Secondary | ICD-10-CM

## 2017-04-02 NOTE — Telephone Encounter (Signed)
Lab orders entered- msg sent to Robin in [facility]. To add these apts to pt's sch.

## 2017-04-09 ENCOUNTER — Encounter: Payer: Self-pay | Admitting: Internal Medicine

## 2017-04-09 ENCOUNTER — Ambulatory Visit
Admission: RE | Admit: 2017-04-09 | Discharge: 2017-04-09 | Disposition: A | Discharge status: Home / Self Care | Payer: Medicare Other | Source: Ambulatory Visit | Attending: Internal Medicine | Admitting: Internal Medicine

## 2017-04-09 ENCOUNTER — Inpatient Hospital Stay: Payer: Medicare Other

## 2017-04-09 ENCOUNTER — Inpatient Hospital Stay: Payer: Medicare Other | Attending: Internal Medicine | Admitting: Internal Medicine

## 2017-04-09 ENCOUNTER — Telehealth: Payer: Self-pay | Admitting: *Deleted

## 2017-04-09 VITALS — BP 137/94 | HR 98 | Temp 97.7°F | Resp 22 | Ht 59.0 in | Wt 131.0 lb

## 2017-04-09 VITALS — BP 126/80 | HR 85 | Temp 97.0°F | Resp 18

## 2017-04-09 DIAGNOSIS — D500 Iron deficiency anemia secondary to blood loss (chronic): Secondary | ICD-10-CM

## 2017-04-09 DIAGNOSIS — F419 Anxiety disorder, unspecified: Secondary | ICD-10-CM

## 2017-04-09 DIAGNOSIS — I878 Other specified disorders of veins: Secondary | ICD-10-CM | POA: Insufficient documentation

## 2017-04-09 DIAGNOSIS — J90 Pleural effusion, not elsewhere classified: Secondary | ICD-10-CM | POA: Insufficient documentation

## 2017-04-09 DIAGNOSIS — R0989 Other specified symptoms and signs involving the circulatory and respiratory systems: Secondary | ICD-10-CM | POA: Diagnosis not present

## 2017-04-09 DIAGNOSIS — Z79899 Other long term (current) drug therapy: Secondary | ICD-10-CM | POA: Diagnosis not present

## 2017-04-09 DIAGNOSIS — F329 Major depressive disorder, single episode, unspecified: Secondary | ICD-10-CM

## 2017-04-09 DIAGNOSIS — I509 Heart failure, unspecified: Secondary | ICD-10-CM | POA: Diagnosis not present

## 2017-04-09 DIAGNOSIS — N281 Cyst of kidney, acquired: Secondary | ICD-10-CM

## 2017-04-09 DIAGNOSIS — R0600 Dyspnea, unspecified: Secondary | ICD-10-CM

## 2017-04-09 DIAGNOSIS — I700 Atherosclerosis of aorta: Secondary | ICD-10-CM | POA: Diagnosis not present

## 2017-04-09 DIAGNOSIS — K449 Diaphragmatic hernia without obstruction or gangrene: Secondary | ICD-10-CM | POA: Diagnosis not present

## 2017-04-09 DIAGNOSIS — R05 Cough: Secondary | ICD-10-CM | POA: Diagnosis not present

## 2017-04-09 DIAGNOSIS — Z8781 Personal history of (healed) traumatic fracture: Secondary | ICD-10-CM

## 2017-04-09 DIAGNOSIS — J9811 Atelectasis: Secondary | ICD-10-CM | POA: Insufficient documentation

## 2017-04-09 DIAGNOSIS — I2510 Atherosclerotic heart disease of native coronary artery without angina pectoris: Secondary | ICD-10-CM

## 2017-04-09 DIAGNOSIS — D509 Iron deficiency anemia, unspecified: Secondary | ICD-10-CM | POA: Diagnosis not present

## 2017-04-09 LAB — CBC WITH DIFFERENTIAL/PLATELET
BASOS PCT: 5 %
Basophils Absolute: 0.2 10*3/uL — ABNORMAL HIGH (ref 0–0.1)
EOS ABS: 0 10*3/uL (ref 0–0.7)
Eosinophils Relative: 1 %
HCT: 32.9 % — ABNORMAL LOW (ref 35.0–47.0)
Hemoglobin: 10.1 g/dL — ABNORMAL LOW (ref 12.0–16.0)
Lymphocytes Relative: 29 %
Lymphs Abs: 1.5 10*3/uL (ref 1.0–3.6)
MCH: 21.4 pg — ABNORMAL LOW (ref 26.0–34.0)
MCHC: 30.7 g/dL — AB (ref 32.0–36.0)
MCV: 69.8 fL — ABNORMAL LOW (ref 80.0–100.0)
MONO ABS: 0.3 10*3/uL (ref 0.2–0.9)
MONOS PCT: 7 %
NEUTROS PCT: 60 %
Neutro Abs: 3.1 10*3/uL (ref 1.4–6.5)
PLATELETS: 438 10*3/uL (ref 150–440)
RBC: 4.71 MIL/uL (ref 3.80–5.20)
RDW: 36.7 % — AB (ref 11.5–14.5)
WBC: 5.2 10*3/uL (ref 3.6–11.0)

## 2017-04-09 LAB — BASIC METABOLIC PANEL
Anion gap: 7 (ref 5–15)
BUN: 25 mg/dL — ABNORMAL HIGH (ref 6–20)
CALCIUM: 8.9 mg/dL (ref 8.9–10.3)
CO2: 23 mmol/L (ref 22–32)
CREATININE: 0.9 mg/dL (ref 0.44–1.00)
Chloride: 106 mmol/L (ref 101–111)
Glucose, Bld: 107 mg/dL — ABNORMAL HIGH (ref 65–99)
Potassium: 4.3 mmol/L (ref 3.5–5.1)
SODIUM: 136 mmol/L (ref 135–145)

## 2017-04-09 LAB — BRAIN NATRIURETIC PEPTIDE: B NATRIURETIC PEPTIDE 5: 3287 pg/mL — AB (ref 0.0–100.0)

## 2017-04-09 LAB — MAGNESIUM: Magnesium: 1.8 mg/dL (ref 1.7–2.4)

## 2017-04-09 MED ORDER — SODIUM CHLORIDE 0.9 % IV SOLN
Freq: Once | INTRAVENOUS | Status: AC
  Administered 2017-04-09: 12:00:00 via INTRAVENOUS
  Filled 2017-04-09: qty 1000

## 2017-04-09 MED ORDER — IRON SUCROSE 20 MG/ML IV SOLN
200.0000 mg | Freq: Once | INTRAVENOUS | Status: AC
  Administered 2017-04-09: 200 mg via INTRAVENOUS
  Filled 2017-04-09: qty 10

## 2017-04-09 NOTE — Patient Instructions (Signed)
Iron Sucrose injection What is this medicine? IRON SUCROSE (AHY ern SOO krohs) is an iron complex. Iron is used to make healthy red blood cells, which carry oxygen and nutrients throughout the body. This medicine is used to treat iron deficiency anemia in people with chronic kidney disease. This medicine may be used for other purposes; ask your health care provider or pharmacist if you have questions. COMMON BRAND NAME(S): Venofer What should I tell my health care provider before I take this medicine? They need to know if you have any of these conditions: -anemia not caused by low iron levels -heart disease -high levels of iron in the blood -kidney disease -liver disease -an unusual or allergic reaction to iron, other medicines, foods, dyes, or preservatives -pregnant or trying to get pregnant -breast-feeding How should I use this medicine? This medicine is for infusion into a vein. It is given by a health care professional in a hospital or clinic setting. Talk to your pediatrician regarding the use of this medicine in children. While this drug may be prescribed for children as young as 2 years for selected conditions, precautions do apply. Overdosage: If you think you have taken too much of this medicine contact a poison control center or emergency room at once. NOTE: This medicine is only for you. Do not share this medicine with others. What if I miss a dose? It is important not to miss your dose. Call your doctor or health care professional if you are unable to keep an appointment. What may interact with this medicine? Do not take this medicine with any of the following medications: -deferoxamine -dimercaprol -other iron products This medicine may also interact with the following medications: -chloramphenicol -deferasirox This list may not describe all possible interactions. Give your health care provider a list of all the medicines, herbs, non-prescription drugs, or dietary  supplements you use. Also tell them if you smoke, drink alcohol, or use illegal drugs. Some items may interact with your medicine. What should I watch for while using this medicine? Visit your doctor or healthcare professional regularly. Tell your doctor or healthcare professional if your symptoms do not start to get better or if they get worse. You may need blood work done while you are taking this medicine. You may need to follow a special diet. Talk to your doctor. Foods that contain iron include: whole grains/cereals, dried fruits, beans, or peas, leafy green vegetables, and organ meats (liver, kidney). What side effects may I notice from receiving this medicine? Side effects that you should report to your doctor or health care professional as soon as possible: -allergic reactions like skin rash, itching or hives, swelling of the face, lips, or tongue -breathing problems -changes in blood pressure -cough -fast, irregular heartbeat -feeling faint or lightheaded, falls -fever or chills -flushing, sweating, or hot feelings -joint or muscle aches/pains -seizures -swelling of the ankles or feet -unusually weak or tired Side effects that usually do not require medical attention (report to your doctor or health care professional if they continue or are bothersome): -diarrhea -feeling achy -headache -irritation at site where injected -nausea, vomiting -stomach upset -tiredness This list may not describe all possible side effects. Call your doctor for medical advice about side effects. You may report side effects to FDA at 1-800-FDA-1088. Where should I keep my medicine? This drug is given in a hospital or clinic and will not be stored at home. NOTE: This sheet is a summary. It may not cover all possible information. If   you have questions about this medicine, talk to your doctor, pharmacist, or health care provider.  2018 Elsevier/Gold Standard (2011-06-28 17:14:35)  

## 2017-04-09 NOTE — Progress Notes (Signed)
[facility] CONSULT NOTE  Patient Care Team: Mickel Fuchs, MD as PCP - General (Family Medicine)  CHIEF COMPLAINTS/PURPOSE OF CONSULTATION: Severe Anemia  # June 2018- SEVERE ANEMIA SEC TO IRON DEFICIENCY [4.7- 5.4] s/p PRBC transfusions; CT-C/A/P- NED for cause of anemia.   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     HISTORY OF PRESENTING ILLNESS:  Sonya Baker 66 y.o.  female was recently evaluated in the hospital for severe iron deficiency anemia of unclear etiology. Patient received PRBC blood transfusion in the hospital- for hemoglobin of 4.7; and at the time of discharge hemoglobin was around 8. Patient was also noted to have CHF exacerbation at this visit.  Patient states that she has been coughing dry nonproductive cough without hemoptysis for the last 2-3 weeks. Since progressive getting short of breath with exertion. Positive for orthopnea. Positive for swelling of the legs. Complains of diffuse body aches. No fevers or chills. She denies a significant increase in the weight.  Patient is not on diuretics.   Patient is awaiting to see GI in 2 days from now. Patient can does deny any blood in stools or black stools. Denies any nausea vomiting. She is on iron pills.  ROS: A complete 10 point review of system is done which is negative except mentioned above in history of present illness  MEDICAL HISTORY:  Past Medical History:  Diagnosis Date  . Anemia   . Anxiety   . Depression   . H/O rotator cuff tear   . Hemorrhoid   . History of wrist fracture   . Lead poisoning   . Migraine     SURGICAL HISTORY: bowel obstruction [almost 10 feet intestine]; c-section.  No past surgical history on file.  SOCIAL HISTORY: Social History   Social History  . Marital status: Single    Spouse name: N/A  . Number of children: N/A  . Years of education: N/A   Occupational History  . Not on file.   Social History Main  Topics  . Smoking status: Never Smoker  . Smokeless tobacco: Never Used  . Alcohol use No  . Drug use: No  . Sexual activity: Not on file   Other Topics Concern  . Not on file   Social History Narrative  . No narrative on file    FAMILY HISTORY: Family History  Problem Relation Age of Onset  . Heart failure Father     ALLERGIES:  is allergic to acetaminophen.  MEDICATIONS:  Current Outpatient Prescriptions  Medication Sig Dispense Refill  . ferrous sulfate 325 (65 FE) MG tablet Take 1 tablet (325 mg total) by mouth daily. 30 tablet 3  . Multiple Vitamin (MULTIVITAMIN) tablet Take 1 tablet by mouth daily.     No current facility-administered medications for this visit.       Marland Kitchen  PHYSICAL EXAMINATION: ECOG PERFORMANCE STATUS: 1 - Symptomatic but completely ambulatory  Vitals:   04/09/17 1051  BP: (!) 137/94  Pulse: 98  Resp: (!) 22  Temp: 97.7 F (36.5 C)   Filed Weights   04/09/17 1051  Weight: 131 lb (59.4 kg)    GENERAL: Well-nourished well-developed; Alert, no distress and comfortable.   Alone.  EYES: no pallor or icterus OROPHARYNX: no thrush or ulceration; good dentition  NECK: supple, no masses felt LYMPH:  no palpable lymphadenopathy in the cervical, axillary or inguinal regions LUNGS: clear to auscultation and  No wheeze or crackles HEART/CVS: regular rate & rhythm and no murmurs; 2 plus lower extremity edema ABDOMEN: abdomen soft, non-tender and normal bowel sounds Musculoskeletal:no cyanosis of digits and no clubbing  PSYCH: alert & oriented x 3 with fluent speech NEURO: no focal motor/sensory deficits SKIN:  no rashes or significant lesions  LABORATORY DATA:  I have reviewed the data as listed Lab Results  Component Value Date   WBC 5.2 04/09/2017   HGB 10.1 (L) 04/09/2017   HCT 32.9 (L) 04/09/2017   MCV 69.8 (L) 04/09/2017   PLT 438 04/09/2017    Recent Labs  03/27/17 0835 03/28/17 0510 04/09/17 1126  NA 136 137 136  K 3.6 3.6  4.3  CL 109 108 106  CO2 22 24 23   GLUCOSE 90 88 107*  BUN 14 17 25*  CREATININE 0.67 0.84 0.90  CALCIUM 8.6* 8.5* 8.9  GFRNONAA >60 >60 >60  GFRAA >60 >60 >60  PROT 6.6  --   --   ALBUMIN 3.5  --   --   AST 32  --   --   ALT 19  --   --   ALKPHOS 73  --   --   BILITOT 0.5  --   --     RADIOGRAPHIC STUDIES: I have personally reviewed the radiological images as listed and agreed with the findings in the report. Dg Chest 2 View  Result Date: 04/09/2017 CLINICAL DATA:  Productive cough, shortness of breath. EXAM: CHEST  2 VIEW COMPARISON:  CT scan of March 27, 2017. FINDINGS: Cardiomediastinal silhouette is borderline in size. Mild central pulmonary vascular congestion is noted. No pneumothorax is noted. Mild bilateral pleural effusions are noted with associated atelectasis. Bony thorax is unremarkable. IMPRESSION: Mild central pulmonary vascular congestion. Mild bilateral pleural effusions with associated bibasilar subsegmental atelectasis. Electronically Signed   By: Lupita RaiderJames  Green Jr, M.D.   On: 04/09/2017 12:55   Ct Chest W Contrast  Result Date: 03/28/2017 CLINICAL DATA:  66 year old female with history of shortness of breath and left-sided chest wall pain with cough and low-grade fever over the last week. Possible pneumonia noted on recent chest x-ray. Currently on antibiotics. Severe anemia. Intermittent nausea and vomiting. EXAM: CT CHEST, ABDOMEN, AND PELVIS WITH CONTRAST TECHNIQUE: Multidetector CT imaging of the chest, abdomen and pelvis was performed following the standard protocol during bolus administration of intravenous contrast. CONTRAST:  100mL ISOVUE-300 IOPAMIDOL (ISOVUE-300) INJECTION 61% COMPARISON:  None. FINDINGS: CT CHEST FINDINGS Cardiovascular: Heart size is mildly enlarged. There is no significant pericardial fluid, thickening or pericardial calcification. There is aortic atherosclerosis, as well as atherosclerosis of the great vessels of the mediastinum and the  coronary arteries, including calcified atherosclerotic plaque in the right coronary artery. Mediastinum/Nodes: No pathologically enlarged mediastinal or hilar lymph nodes. Moderate-sized hiatal hernia. No axillary lymphadenopathy. Lungs/Pleura: Moderate right and small left pleural effusions lying dependently. Dependent areas of subsegmental atelectasis are noted in the lower lobes of the lungs bilaterally (right greater than left). No definite consolidative airspace disease. Diffuse bronchial wall thickening with diffuse interlobular septal thickening, suggesting a background of mild interstitial pulmonary edema. No definite suspicious appearing pulmonary nodules or masses are noted. Musculoskeletal: There are no aggressive appearing lytic or blastic lesions noted in the visualized portions of the skeleton. CT ABDOMEN PELVIS FINDINGS Hepatobiliary: Small capsular calcification overlying segment 4A of the liver is likely related to remote trauma. No suspicious cystic or solid hepatic lesions are noted. No intra or extrahepatic biliary ductal dilatation. Gallbladder  is normal in appearance. Pancreas: No pancreatic mass. No pancreatic ductal dilatation. No pancreatic or peripancreatic fluid or inflammatory changes. Spleen: Unremarkable. Adrenals/Urinary Tract: 16 mm simple cyst in the lower pole of the left kidney. Subcentimeter lesion in the interpolar region of the left kidney is too small to definitively characterize, but is statistically likely to represent a cyst. Right kidney and bilateral adrenal glands are normal in appearance. There is no hydroureteronephrosis. Urinary bladder is normal in appearance. Stomach/Bowel: The appearance of the intraabdominal portion of the stomach is normal. There is no pathologic dilatation of small bowel or colon. A few scattered colonic diverticulae are noted, without surrounding inflammatory changes to suggest an acute diverticulitis at this time. The appendix is not  confidently identified and may be surgically absent. Regardless, there are no inflammatory changes noted adjacent to the cecum to suggest the presence of an acute appendicitis at this time. Vascular/Lymphatic: Aortic atherosclerosis, without evidence of aneurysm or dissection in the abdominal or pelvic vasculature. No lymphadenopathy noted in the abdomen or pelvis. Reproductive: Uterus and ovaries are unremarkable in appearance. Other: No significant volume of ascites.  No pneumoperitoneum. Musculoskeletal: Old compression fracture of superior endplate of L4 with approximately 20% loss of anterior vertebral body height. There are no aggressive appearing lytic or blastic lesions noted in the visualized portions of the skeleton. IMPRESSION: 1. The appearance of the chest suggests congestive heart failure, as discussed above. 2. No airspace consolidation identified at this time to suggest underlying pneumonia. 3. Aortic atherosclerosis, in addition to right coronary artery disease. Please note that although the presence of coronary artery calcium documents the presence of coronary artery disease, the severity of this disease and any potential stenosis cannot be assessed on this non-gated CT examination. Assessment for potential risk factor modification, dietary therapy or pharmacologic therapy may be warranted, if clinically indicated. 4. Mild colonic diverticulosis without evidence to suggest an acute diverticulitis at this time. 5. Additional incidental findings, as above. Electronically Signed   By: Trudie Reed M.D.   On: 03/28/2017 08:35   Ct Abdomen Pelvis W Contrast  Result Date: 03/28/2017 CLINICAL DATA:  66 year old female with history of shortness of breath and left-sided chest wall pain with cough and low-grade fever over the last week. Possible pneumonia noted on recent chest x-ray. Currently on antibiotics. Severe anemia. Intermittent nausea and vomiting. EXAM: CT CHEST, ABDOMEN, AND PELVIS WITH  CONTRAST TECHNIQUE: Multidetector CT imaging of the chest, abdomen and pelvis was performed following the standard protocol during bolus administration of intravenous contrast. CONTRAST:  ISOVUE-300 IOPAMIDOL (ISOVUE-300) INJECTION 61% COMPARISON:  None. FINDINGS: CT CHEST FINDINGS Cardiovascular: Heart size is mildly enlarged. There is no significant pericardial fluid, thickening or pericardial calcification. There is aortic atherosclerosis, as well as atherosclerosis of the great vessels of the mediastinum and the coronary arteries, including calcified atherosclerotic plaque in the right coronary artery. Mediastinum/Nodes: No pathologically enlarged mediastinal or hilar lymph nodes. Moderate-sized hiatal hernia. No axillary lymphadenopathy. Lungs/Pleura: Moderate right and small left pleural effusions lying dependently. Dependent areas of subsegmental atelectasis are noted in the lower lobes of the lungs bilaterally (right greater than left). No definite consolidative airspace disease. Diffuse bronchial wall thickening with diffuse interlobular septal thickening, suggesting a background of mild interstitial pulmonary edema. No definite suspicious appearing pulmonary nodules or masses are noted. Musculoskeletal: There are no aggressive appearing lytic or blastic lesions noted in the visualized portions of the skeleton. CT ABDOMEN PELVIS FINDINGS Hepatobiliary: Small capsular calcification overlying segment 4A of the  liver is likely related to remote trauma. No suspicious cystic or solid hepatic lesions are noted. No intra or extrahepatic biliary ductal dilatation. Gallbladder is normal in appearance. Pancreas: No pancreatic mass. No pancreatic ductal dilatation. No pancreatic or peripancreatic fluid or inflammatory changes. Spleen: Unremarkable. Adrenals/Urinary Tract: 16 mm simple cyst in the lower pole of the left kidney. Subcentimeter lesion in the interpolar region of the left kidney is too small to  definitively characterize, but is statistically likely to represent a cyst. Right kidney and bilateral adrenal glands are normal in appearance. There is no hydroureteronephrosis. Urinary bladder is normal in appearance. Stomach/Bowel: The appearance of the intraabdominal portion of the stomach is normal. There is no pathologic dilatation of small bowel or colon. A few scattered colonic diverticulae are noted, without surrounding inflammatory changes to suggest an acute diverticulitis at this time. The appendix is not confidently identified and may be surgically absent. Regardless, there are no inflammatory changes noted adjacent to the cecum to suggest the presence of an acute appendicitis at this time. Vascular/Lymphatic: Aortic atherosclerosis, without evidence of aneurysm or dissection in the abdominal or pelvic vasculature. No lymphadenopathy noted in the abdomen or pelvis. Reproductive: Uterus and ovaries are unremarkable in appearance. Other: No significant volume of ascites.  No pneumoperitoneum. Musculoskeletal: Old compression fracture of superior endplate of L4 with approximately 20% loss of anterior vertebral body height. There are no aggressive appearing lytic or blastic lesions noted in the visualized portions of the skeleton. IMPRESSION: 1. The appearance of the chest suggests congestive heart failure, as discussed above. 2. No airspace consolidation identified at this time to suggest underlying pneumonia. 3. Aortic atherosclerosis, in addition to right coronary artery disease. Please note that although the presence of coronary artery calcium documents the presence of coronary artery disease, the severity of this disease and any potential stenosis cannot be assessed on this non-gated CT examination. Assessment for potential risk factor modification, dietary therapy or pharmacologic therapy may be warranted, if clinically indicated. 4. Mild colonic diverticulosis without evidence to suggest an acute  diverticulitis at this time. 5. Additional incidental findings, as above. Electronically Signed   By: Trudie Reed M.D.   On: 03/28/2017 08:35    ASSESSMENT & PLAN:   Iron deficiency anemia due to chronic blood loss # Severe iron deficiency anemia- [at presentation 4.5] hemoglobin- today is 10. Still microcytic. Recommend IV iron Venofer 4 weekly. Etiology of iron deficiency is unclear- question malabsorption secondary to remote history of bowel surgery [unlikely]. Awaiting GI evaluation- patient will likely need endoscopies.  # acute/chronic SOB-CHF exacerbation- recommend starting lasix 40mg  day; also K- 20 mwq /day; get CXR; BNP; BMP; Mag.  # Weekly IV venofer x4; cbc/in 4 weeks-MD.   Addendum- chest x-ray suggestive of CHF. Recommended Lasix potassium as above. Also will contact PCP office for follow up cardiology/evalutaion.   All questions were answered. The patient knows to call the clinic with any problems, questions or concerns.   Earna Coder, MD 04/09/2017 1:43 PM

## 2017-04-09 NOTE — Assessment & Plan Note (Addendum)
#   Severe iron deficiency anemia- [at presentation 4.5] hemoglobin- today is 10. Still microcytic. Recommend IV iron Venofer 4 weekly. Etiology of iron deficiency is unclear- question malabsorption secondary to remote history of bowel surgery [unlikely]. Awaiting GI evaluation- patient will likely need endoscopies.  # acute/chronic SOB-CHF exacerbation- recommend starting lasix 40mg  day; also K- 20 mwq /day; get CXR; BNP; BMP; Mag.  # Weekly IV venofer x4; cbc/in 4 weeks-MD.   Addendum- chest x-ray suggestive of CHF. Recommended Lasix potassium as above. Also will contact PCP office for follow up cardiology/evalutaion.   # Addendum- patient's BNP 3000+; increase Lasix to 40 mg twice a day for 3 days ; and then PCP titrate;sent prescription kdur 40 twice a day;sent to pharmacy. Spoke to PCPs office Judeth CornfieldStephanie to relay the above to Dr.Wroth/my contact was also given.

## 2017-04-09 NOTE — Telephone Encounter (Signed)
Spoke with patient. Results provided to patient. She already has lasix 40 mg tablets at home. She stated that she has a pill splitter and would score the tablet in half and take half in the am and the other half at bedtime. She already has potassium at home as well. Pt states that she potassium 20 meq at home as well. Pt instructed to take potassium once daily. She will call her pcp for an apt for this Friday as Friday apt's work better for her.

## 2017-04-09 NOTE — Telephone Encounter (Signed)
-----   Message from Earna CoderGovinda R Brahmanday, MD sent at 04/09/2017  1:30 PM EDT ----- Please inform patient that chest x-ray- fluid buildup in the lungs/congestive heart failure. Recommend Lasix 20 mg twice a day; potassium 20 mEq once a day [pt states she has medications at home]. And follow up with PCP. And also, inform PCP's office re: our plan; and that she should follow up with PCP in next few days.

## 2017-04-09 NOTE — Progress Notes (Signed)
Patient presents to clinic today with multiple complaints. Pt c/o severe fatigue and over all body aches "all extremities, shoulders, back, abdomen and sternum." pt reports unproductive cough/wheeze. Pt states that she "feels horrible." pt reports "flushing sensations and frequent hot flahes." pt stated that she has a h/o non bleeding hemorrhoids. She is scheduled to see gastrologist this week. Pt reports numbness and tingling in her fingertips. Patient c/o nausea and acid reflux/heartburn and decreased appetite  Per md order- ambulatory sats - 2 mins walk performed. Pt had to stop multiple times during 2 minute walk due to pain in her "extreme pain in my legs." pt reported that she was "going to throw up if I don't stop walking. I can not walk anymore." pt provided with w/c. Sats maintained at 94% RA. HR-115-120 when walking but improved to 90 when pt stopped walking.

## 2017-04-11 ENCOUNTER — Ambulatory Visit (INDEPENDENT_AMBULATORY_CARE_PROVIDER_SITE_OTHER): Payer: Medicare Other | Admitting: Gastroenterology

## 2017-04-11 ENCOUNTER — Encounter: Payer: Self-pay | Admitting: Gastroenterology

## 2017-04-11 VITALS — BP 127/88 | HR 97 | Temp 97.9°F | Ht 59.0 in | Wt 127.8 lb

## 2017-04-11 DIAGNOSIS — D509 Iron deficiency anemia, unspecified: Secondary | ICD-10-CM

## 2017-04-11 NOTE — Progress Notes (Signed)
Wyline Mood MD, MRCP(U.K) [address]  [address]  [place], [place] [address]  Main: [phone]  Fax: [phone]   Gastroenterology Consultation  Referring Provider:     Lovey Newcomer, * Primary Care Physician:  Mickel Fuchs, MD Primary Gastroenterologist:  Dr. Wyline Mood  Reason for Consultation:     Iron deficiency anemia         HPI:   Loneta Tamplin is a 66 y.o. y/o female referred for consultation & management  by Dr. Butler Denmark, Sydnee Cabal, MD.     She has been referred for iron deficiency anemia. She was recently admitted on 03/27/17 after on routine labs was found to have a Hb of 5.7 grms and was sent to the ER , was found to have very low iron of 9 and ferritin of 3. She was seen by Dr Donneta Romberg , CT scan of her chest/ abdomen was negative for anemia bu showed features of CHF. Received IV iron and discharged with plan for out patient endoscopy . During the visit was also noted to have CHF exaceberation. X ray of chest on 04/09/17 showed mild b/l pleural effsions , atelectasis .  BNF was 3200.  Interval history 03/28/17-04/11/17  Rectal bleeding: no  Nose bleeds: no  Vaginal bleeding : no  Hematemesis or hemoptysis : no  Blood in urine : no   Never had a colonoscopy , last EGD was at the age of 58 she recalls when she had a bowel obstruction -says was very severely dehydrated at that time. No change in her bowel habits. 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 Does consume some chicken . She has false teeth but not using presently . Not on any blood thinners. On iron infusions. Has not started the lasix which she was prescribed. Still having some cough , uses 2 pillows at night and sleeps sitting up at night . Can walk 15 steps before she gets short of breath. No NSAID's. She says she was taking ibuprofen 1-2 tablets a day for headaches for a few years till 03/2017 .        Past Medical History:  Diagnosis Date  . Anemia   . Anxiety   . Depression     . H/O rotator cuff tear   . Hemorrhoid   . History of wrist fracture   . Lead poisoning   . Migraine     No past surgical history on file.  Prior to Admission medications   Medication Sig Start Date End Date Taking? Authorizing Provider  ferrous sulfate 325 (65 FE) MG tablet Take 1 tablet (325 mg total) by mouth daily. 03/28/17 03/28/18  Katha Hamming, MD  Multiple Vitamin (MULTIVITAMIN) tablet Take 1 tablet by mouth daily.    [provider]    Family History  Problem Relation Age of Onset  . Heart failure Father      Social History  Substance Use Topics  . Smoking status: Never Smoker  . Smokeless tobacco: Never Used  . Alcohol use No    Allergies as of 04/11/2017 - Review Complete 04/09/2017  Allergen Reaction Noted  . Acetaminophen  04/09/2017    Review of Systems:    All systems reviewed and negative except where noted in HPI.   Physical Exam:  There were no vitals taken for this visit. No LMP recorded. Patient is postmenopausal. Psych:  Alert and cooperative. Normal mood and affect.coughing  General:   Alert,  Well-developed, well-nourished, pleasant  and cooperative in NAD Head:  Normocephalic and atraumatic. Eyes:  Sclera clear, no icterus.   Conjunctiva pink. Ears:  Normal auditory acuity. Nose:  No deformity, discharge, or lesions. Mouth:  No deformity or lesions,oropharynx pink & moist. Neck:  Supple; no masses or thyromegaly. Lungs:  Air entry b/l present although diminished , no air entry rt infraschapular area. No additional breath sounds  Heart:  Regular rate and rhythm; no murmurs, clicks, rubs, or gallops. Abdomen:  Normal bowel sounds.  No bruits.  Soft, non-tender and non-distended without masses, hepatosplenomegaly or hernias noted.  No guarding or rebound tenderness.    Pulses:  Normal pulses noted. Extremities: 1+ b/l edema No cyanosis. Neurologic:  Alert and oriented x3;  grossly normal neurologically. Skin:  Intact without  significant lesions or rashes. No jaundice. Lymph Nodes:  No significant cervical adenopathy. Psych:  Alert and cooperative. Normal mood and affect.  Imaging Studies: Dg Chest 2 View  Result Date: 04/09/2017 CLINICAL DATA:  Productive cough, shortness of breath. EXAM: CHEST  2 VIEW COMPARISON:  CT scan of March 27, 2017. FINDINGS: Cardiomediastinal silhouette is borderline in size. Mild central pulmonary vascular congestion is noted. No pneumothorax is noted. Mild bilateral pleural effusions are noted with associated atelectasis. Bony thorax is unremarkable. IMPRESSION: Mild central pulmonary vascular congestion. Mild bilateral pleural effusions with associated bibasilar subsegmental atelectasis. Electronically Signed   By: Lupita RaiderJames  Green Jr, M.D.   On: 04/09/2017 12:55   Ct Chest W Contrast  Result Date: 03/28/2017 CLINICAL DATA:  66 year old female with history of shortness of breath and left-sided chest wall pain with cough and low-grade fever over the last week. Possible pneumonia noted on recent chest x-ray. Currently on antibiotics. Severe anemia. Intermittent nausea and vomiting. EXAM: CT CHEST, ABDOMEN, AND PELVIS WITH CONTRAST TECHNIQUE: Multidetector CT imaging of the chest, abdomen and pelvis was performed following the standard protocol during bolus administration of intravenous contrast. CONTRAST:  100mL ISOVUE-300 IOPAMIDOL (ISOVUE-300) INJECTION 61% COMPARISON:  None. FINDINGS: CT CHEST FINDINGS Cardiovascular: Heart size is mildly enlarged. There is no significant pericardial fluid, thickening or pericardial calcification. There is aortic atherosclerosis, as well as atherosclerosis of the great vessels of the mediastinum and the coronary arteries, including calcified atherosclerotic plaque in the right coronary artery. Mediastinum/Nodes: No pathologically enlarged mediastinal or hilar lymph nodes. Moderate-sized hiatal hernia. No axillary lymphadenopathy. Lungs/Pleura: Moderate right and  small left pleural effusions lying dependently. Dependent areas of subsegmental atelectasis are noted in the lower lobes of the lungs bilaterally (right greater than left). No definite consolidative airspace disease. Diffuse bronchial wall thickening with diffuse interlobular septal thickening, suggesting a background of mild interstitial pulmonary edema. No definite suspicious appearing pulmonary nodules or masses are noted. Musculoskeletal: There are no aggressive appearing lytic or blastic lesions noted in the visualized portions of the skeleton. CT ABDOMEN PELVIS FINDINGS Hepatobiliary: Small capsular calcification overlying segment 4A of the liver is likely related to remote trauma. No suspicious cystic or solid hepatic lesions are noted. No intra or extrahepatic biliary ductal dilatation. Gallbladder is normal in appearance. Pancreas: No pancreatic mass. No pancreatic ductal dilatation. No pancreatic or peripancreatic fluid or inflammatory changes. Spleen: Unremarkable. Adrenals/Urinary Tract: 16 mm simple cyst in the lower pole of the left kidney. Subcentimeter lesion in the interpolar region of the left kidney is too small to definitively characterize, but is statistically likely to represent a cyst. Right kidney and bilateral adrenal glands are normal in appearance. There is no hydroureteronephrosis. Urinary bladder is normal in appearance.  Stomach/Bowel: The appearance of the intraabdominal portion of the stomach is normal. There is no pathologic dilatation of small bowel or colon. A few scattered colonic diverticulae are noted, without surrounding inflammatory changes to suggest an acute diverticulitis at this time. The appendix is not confidently identified and may be surgically absent. Regardless, there are no inflammatory changes noted adjacent to the cecum to suggest the presence of an acute appendicitis at this time. Vascular/Lymphatic: Aortic atherosclerosis, without evidence of aneurysm or  dissection in the abdominal or pelvic vasculature. No lymphadenopathy noted in the abdomen or pelvis. Reproductive: Uterus and ovaries are unremarkable in appearance. Other: No significant volume of ascites.  No pneumoperitoneum. Musculoskeletal: Old compression fracture of superior endplate of L4 with approximately 20% loss of anterior vertebral body height. There are no aggressive appearing lytic or blastic lesions noted in the visualized portions of the skeleton. IMPRESSION: 1. The appearance of the chest suggests congestive heart failure, as discussed above. 2. No airspace consolidation identified at this time to suggest underlying pneumonia. 3. Aortic atherosclerosis, in addition to right coronary artery disease. Please note that although the presence of coronary artery calcium documents the presence of coronary artery disease, the severity of this disease and any potential stenosis cannot be assessed on this non-gated CT examination. Assessment for potential risk factor modification, dietary therapy or pharmacologic therapy may be warranted, if clinically indicated. 4. Mild colonic diverticulosis without evidence to suggest an acute diverticulitis at this time. 5. Additional incidental findings, as above. Electronically Signed   By: Trudie Reed M.D.   On: 03/28/2017 08:35   Ct Abdomen Pelvis W Contrast  Result Date: 03/28/2017 CLINICAL DATA:  66 year old female with history of shortness of breath and left-sided chest wall pain with cough and low-grade fever over the last week. Possible pneumonia noted on recent chest x-ray. Currently on antibiotics. Severe anemia. Intermittent nausea and vomiting. EXAM: CT CHEST, ABDOMEN, AND PELVIS WITH CONTRAST TECHNIQUE: Multidetector CT imaging of the chest, abdomen and pelvis was performed following the standard protocol during bolus administration of intravenous contrast. CONTRAST:  ISOVUE-300 IOPAMIDOL (ISOVUE-300) INJECTION 61% COMPARISON:  None.  FINDINGS: CT CHEST FINDINGS Cardiovascular: Heart size is mildly enlarged. There is no significant pericardial fluid, thickening or pericardial calcification. There is aortic atherosclerosis, as well as atherosclerosis of the great vessels of the mediastinum and the coronary arteries, including calcified atherosclerotic plaque in the right coronary artery. Mediastinum/Nodes: No pathologically enlarged mediastinal or hilar lymph nodes. Moderate-sized hiatal hernia. No axillary lymphadenopathy. Lungs/Pleura: Moderate right and small left pleural effusions lying dependently. Dependent areas of subsegmental atelectasis are noted in the lower lobes of the lungs bilaterally (right greater than left). No definite consolidative airspace disease. Diffuse bronchial wall thickening with diffuse interlobular septal thickening, suggesting a background of mild interstitial pulmonary edema. No definite suspicious appearing pulmonary nodules or masses are noted. Musculoskeletal: There are no aggressive appearing lytic or blastic lesions noted in the visualized portions of the skeleton. CT ABDOMEN PELVIS FINDINGS Hepatobiliary: Small capsular calcification overlying segment 4A of the liver is likely related to remote trauma. No suspicious cystic or solid hepatic lesions are noted. No intra or extrahepatic biliary ductal dilatation. Gallbladder is normal in appearance. Pancreas: No pancreatic mass. No pancreatic ductal dilatation. No pancreatic or peripancreatic fluid or inflammatory changes. Spleen: Unremarkable. Adrenals/Urinary Tract: 16 mm simple cyst in the lower pole of the left kidney. Subcentimeter lesion in the interpolar region of the left kidney is too small to definitively characterize, but is statistically likely  to represent a cyst. Right kidney and bilateral adrenal glands are normal in appearance. There is no hydroureteronephrosis. Urinary bladder is normal in appearance. Stomach/Bowel: The appearance of the  intraabdominal portion of the stomach is normal. There is no pathologic dilatation of small bowel or colon. A few scattered colonic diverticulae are noted, without surrounding inflammatory changes to suggest an acute diverticulitis at this time. The appendix is not confidently identified and may be surgically absent. Regardless, there are no inflammatory changes noted adjacent to the cecum to suggest the presence of an acute appendicitis at this time. Vascular/Lymphatic: Aortic atherosclerosis, without evidence of aneurysm or dissection in the abdominal or pelvic vasculature. No lymphadenopathy noted in the abdomen or pelvis. Reproductive: Uterus and ovaries are unremarkable in appearance. Other: No significant volume of ascites.  No pneumoperitoneum. Musculoskeletal: Old compression fracture of superior endplate of L4 with approximately 20% loss of anterior vertebral body height. There are no aggressive appearing lytic or blastic lesions noted in the visualized portions of the skeleton. IMPRESSION: 1. The appearance of the chest suggests congestive heart failure, as discussed above. 2. No airspace consolidation identified at this time to suggest underlying pneumonia. 3. Aortic atherosclerosis, in addition to right coronary artery disease. Please note that although the presence of coronary artery calcium documents the presence of coronary artery disease, the severity of this disease and any potential stenosis cannot be assessed on this non-gated CT examination. Assessment for potential risk factor modification, dietary therapy or pharmacologic therapy may be warranted, if clinically indicated. 4. Mild colonic diverticulosis without evidence to suggest an acute diverticulitis at this time. 5. Additional incidental findings, as above. Electronically Signed   By: Trudie Reed M.D.   On: 03/28/2017 08:35    Assessment and Plan:   Jasman Pfeifle is a 65 y.o. y/o female has been referred for iron deficiency anemia.  No overt blood loss. She had a BNP > 3000 on 04/09/17 , recovering from a recent pneumonia.    Plan  1. Urine analysis to check for blood loss 2. EGD+colonoscopy +/- capsule study when cleared by cardiology from her CHF point of view  . Presently has a very high BNP, clinically coughing a lot and has decreased air entry RT base of lung   I have discussed alternative options, risks & benefits,  which include, but are not limited to, bleeding, infection, perforation,respiratory complication & drug reaction.  The patient agrees with this plan & written consent will be obtained.     Follow up in 3 months   Dr Wyline Mood MD,MRCP(U.K)

## 2017-04-16 ENCOUNTER — Ambulatory Visit: Payer: Medicare Other

## 2017-04-16 ENCOUNTER — Inpatient Hospital Stay: Payer: Medicare Other

## 2017-04-16 ENCOUNTER — Telehealth: Payer: Self-pay | Admitting: Internal Medicine

## 2017-04-16 VITALS — BP 135/81 | HR 95 | Temp 96.8°F | Resp 20

## 2017-04-16 DIAGNOSIS — D509 Iron deficiency anemia, unspecified: Secondary | ICD-10-CM | POA: Diagnosis not present

## 2017-04-16 DIAGNOSIS — D500 Iron deficiency anemia secondary to blood loss (chronic): Secondary | ICD-10-CM

## 2017-04-16 MED ORDER — SODIUM CHLORIDE 0.9 % IV SOLN
Freq: Once | INTRAVENOUS | Status: AC
  Administered 2017-04-16: 10:00:00 via INTRAVENOUS
  Filled 2017-04-16: qty 1000

## 2017-04-16 MED ORDER — IRON SUCROSE 20 MG/ML IV SOLN
200.0000 mg | Freq: Once | INTRAVENOUS | Status: AC
  Administered 2017-04-16: 200 mg via INTRAVENOUS

## 2017-04-16 MED ORDER — POTASSIUM CHLORIDE CRYS ER 20 MEQ PO TBCR: 40.0000 meq | EXTENDED_RELEASE_TABLET | Freq: Two times a day (BID) | ORAL | 3 refills | Status: AC

## 2017-04-16 MED ORDER — SODIUM CHLORIDE 0.9 % IV SOLN: 200.0000 mg | Freq: Once | INTRAVENOUS | Status: DC

## 2017-04-16 NOTE — Patient Instructions (Signed)
Iron Sucrose injection What is this medicine? IRON SUCROSE (AHY ern SOO krohs) is an iron complex. Iron is used to make healthy red blood cells, which carry oxygen and nutrients throughout the body. This medicine is used to treat iron deficiency anemia in people with chronic kidney disease. This medicine may be used for other purposes; ask your health care provider or pharmacist if you have questions. COMMON BRAND NAME(S): Venofer What should I tell my health care provider before I take this medicine? They need to know if you have any of these conditions: -anemia not caused by low iron levels -heart disease -high levels of iron in the blood -kidney disease -liver disease -an unusual or allergic reaction to iron, other medicines, foods, dyes, or preservatives -pregnant or trying to get pregnant -breast-feeding How should I use this medicine? This medicine is for infusion into a vein. It is given by a health care professional in a hospital or clinic setting. Talk to your pediatrician regarding the use of this medicine in children. While this drug may be prescribed for children as young as 2 years for selected conditions, precautions do apply. Overdosage: If you think you have taken too much of this medicine contact a poison control center or emergency room at once. NOTE: This medicine is only for you. Do not share this medicine with others. What if I miss a dose? It is important not to miss your dose. Call your doctor or health care professional if you are unable to keep an appointment. What may interact with this medicine? Do not take this medicine with any of the following medications: -deferoxamine -dimercaprol -other iron products This medicine may also interact with the following medications: -chloramphenicol -deferasirox This list may not describe all possible interactions. Give your health care provider a list of all the medicines, herbs, non-prescription drugs, or dietary  supplements you use. Also tell them if you smoke, drink alcohol, or use illegal drugs. Some items may interact with your medicine. What should I watch for while using this medicine? Visit your doctor or healthcare professional regularly. Tell your doctor or healthcare professional if your symptoms do not start to get better or if they get worse. You may need blood work done while you are taking this medicine. You may need to follow a special diet. Talk to your doctor. Foods that contain iron include: whole grains/cereals, dried fruits, beans, or peas, leafy green vegetables, and organ meats (liver, kidney). What side effects may I notice from receiving this medicine? Side effects that you should report to your doctor or health care professional as soon as possible: -allergic reactions like skin rash, itching or hives, swelling of the face, lips, or tongue -breathing problems -changes in blood pressure -cough -fast, irregular heartbeat -feeling faint or lightheaded, falls -fever or chills -flushing, sweating, or hot feelings -joint or muscle aches/pains -seizures -swelling of the ankles or feet -unusually weak or tired Side effects that usually do not require medical attention (report to your doctor or health care professional if they continue or are bothersome): -diarrhea -feeling achy -headache -irritation at site where injected -nausea, vomiting -stomach upset -tiredness This list may not describe all possible side effects. Call your doctor for medical advice about side effects. You may report side effects to FDA at 1-800-FDA-1088. Where should I keep my medicine? This drug is given in a hospital or clinic and will not be stored at home. NOTE: This sheet is a summary. It may not cover all possible information. If   you have questions about this medicine, talk to your doctor, pharmacist, or health care provider.  2018 Elsevier/Gold Standard (2011-06-28 17:14:35)  

## 2017-04-16 NOTE — Telephone Encounter (Signed)
Recommend lasix 40  BID x 3 days;and then 20 BID [defer to PCP re: titration of lasix] Potassium 20  BID [sent to pharmacy]; spoke to PCP's office.

## 2017-04-23 ENCOUNTER — Inpatient Hospital Stay: Payer: Medicare Other

## 2017-04-23 VITALS — BP 114/79 | HR 84 | Temp 95.7°F | Resp 20

## 2017-04-23 DIAGNOSIS — D509 Iron deficiency anemia, unspecified: Secondary | ICD-10-CM | POA: Diagnosis not present

## 2017-04-23 DIAGNOSIS — D500 Iron deficiency anemia secondary to blood loss (chronic): Secondary | ICD-10-CM

## 2017-04-23 MED ORDER — SODIUM CHLORIDE 0.9 % IV SOLN
Freq: Once | INTRAVENOUS | Status: AC
  Administered 2017-04-23: 11:00:00 via INTRAVENOUS
  Filled 2017-04-23: qty 1000

## 2017-04-23 MED ORDER — SODIUM CHLORIDE 0.9 % IV SOLN: 200.0000 mg | Freq: Once | INTRAVENOUS | Status: DC

## 2017-04-23 MED ORDER — IRON SUCROSE 20 MG/ML IV SOLN
200.0000 mg | Freq: Once | INTRAVENOUS | Status: AC
  Administered 2017-04-23: 200 mg via INTRAVENOUS

## 2017-04-23 NOTE — Patient Instructions (Signed)

## 2017-04-30 ENCOUNTER — Inpatient Hospital Stay: Payer: Medicare Other

## 2017-04-30 VITALS — BP 112/79 | HR 98 | Temp 96.1°F | Resp 20

## 2017-04-30 DIAGNOSIS — D509 Iron deficiency anemia, unspecified: Secondary | ICD-10-CM | POA: Diagnosis not present

## 2017-04-30 DIAGNOSIS — D500 Iron deficiency anemia secondary to blood loss (chronic): Secondary | ICD-10-CM

## 2017-04-30 MED ORDER — IRON SUCROSE 20 MG/ML IV SOLN
200.0000 mg | Freq: Once | INTRAVENOUS | Status: AC
  Administered 2017-04-30: 200 mg via INTRAVENOUS
  Filled 2017-04-30: qty 10

## 2017-04-30 MED ORDER — SODIUM CHLORIDE 0.9 % IV SOLN
Freq: Once | INTRAVENOUS | Status: AC
  Administered 2017-04-30: 10:00:00 via INTRAVENOUS
  Filled 2017-04-30: qty 1000

## 2017-04-30 NOTE — Patient Instructions (Signed)

## 2017-05-06 ENCOUNTER — Other Ambulatory Visit: Payer: Self-pay | Admitting: Family Medicine

## 2017-05-06 DIAGNOSIS — Z0000 Encounter for general adult medical examination without abnormal findings: Secondary | ICD-10-CM

## 2017-05-07 ENCOUNTER — Ambulatory Visit: Payer: Medicare Other

## 2017-05-07 ENCOUNTER — Ambulatory Visit: Payer: Medicare Other | Admitting: Internal Medicine

## 2017-05-07 ENCOUNTER — Other Ambulatory Visit: Payer: Medicare Other

## 2017-05-14 ENCOUNTER — Inpatient Hospital Stay: Payer: Medicare Other | Admitting: Internal Medicine

## 2017-05-14 ENCOUNTER — Inpatient Hospital Stay (HOSPITAL_BASED_OUTPATIENT_CLINIC_OR_DEPARTMENT_OTHER): Payer: Medicare Other | Admitting: Internal Medicine

## 2017-05-14 ENCOUNTER — Inpatient Hospital Stay: Payer: Medicare Other

## 2017-05-14 ENCOUNTER — Inpatient Hospital Stay: Payer: Medicare Other | Attending: Internal Medicine

## 2017-05-14 VITALS — BP 129/90 | HR 88 | Temp 95.1°F | Resp 18

## 2017-05-14 VITALS — BP 137/82 | HR 96 | Temp 97.8°F | Resp 20 | Ht 59.0 in | Wt 126.0 lb

## 2017-05-14 DIAGNOSIS — Z77011 Contact with and (suspected) exposure to lead: Secondary | ICD-10-CM | POA: Diagnosis not present

## 2017-05-14 DIAGNOSIS — F329 Major depressive disorder, single episode, unspecified: Secondary | ICD-10-CM | POA: Diagnosis not present

## 2017-05-14 DIAGNOSIS — Z79899 Other long term (current) drug therapy: Secondary | ICD-10-CM | POA: Insufficient documentation

## 2017-05-14 DIAGNOSIS — D509 Iron deficiency anemia, unspecified: Secondary | ICD-10-CM | POA: Insufficient documentation

## 2017-05-14 DIAGNOSIS — R0602 Shortness of breath: Secondary | ICD-10-CM | POA: Insufficient documentation

## 2017-05-14 DIAGNOSIS — I509 Heart failure, unspecified: Secondary | ICD-10-CM | POA: Diagnosis not present

## 2017-05-14 DIAGNOSIS — G4700 Insomnia, unspecified: Secondary | ICD-10-CM | POA: Insufficient documentation

## 2017-05-14 DIAGNOSIS — F419 Anxiety disorder, unspecified: Secondary | ICD-10-CM | POA: Insufficient documentation

## 2017-05-14 DIAGNOSIS — Z8781 Personal history of (healed) traumatic fracture: Secondary | ICD-10-CM | POA: Insufficient documentation

## 2017-05-14 DIAGNOSIS — R0600 Dyspnea, unspecified: Secondary | ICD-10-CM

## 2017-05-14 DIAGNOSIS — R05 Cough: Secondary | ICD-10-CM

## 2017-05-14 DIAGNOSIS — D500 Iron deficiency anemia secondary to blood loss (chronic): Secondary | ICD-10-CM

## 2017-05-14 DIAGNOSIS — M7989 Other specified soft tissue disorders: Secondary | ICD-10-CM | POA: Insufficient documentation

## 2017-05-14 LAB — CBC WITH DIFFERENTIAL/PLATELET
Basophils Absolute: 0 10*3/uL (ref 0–0.1)
Basophils Relative: 1 %
EOS ABS: 0.3 10*3/uL (ref 0–0.7)
Eosinophils Relative: 5 %
HEMATOCRIT: 37.9 % (ref 35.0–47.0)
HEMOGLOBIN: 12.3 g/dL (ref 12.0–16.0)
LYMPHS ABS: 1.7 10*3/uL (ref 1.0–3.6)
LYMPHS PCT: 32 %
MCH: 25.3 pg — AB (ref 26.0–34.0)
MCHC: 32.6 g/dL (ref 32.0–36.0)
MCV: 77.8 fL — AB (ref 80.0–100.0)
MONOS PCT: 7 %
Monocytes Absolute: 0.4 10*3/uL (ref 0.2–0.9)
NEUTROS PCT: 55 %
Neutro Abs: 3 10*3/uL (ref 1.4–6.5)
Platelets: 248 10*3/uL (ref 150–440)
RBC: 4.86 MIL/uL (ref 3.80–5.20)
RDW: 31.1 % — ABNORMAL HIGH (ref 11.5–14.5)
WBC: 5.4 10*3/uL (ref 3.6–11.0)

## 2017-05-14 MED ORDER — SODIUM CHLORIDE 0.9 % IV SOLN
Freq: Once | INTRAVENOUS | Status: AC
  Administered 2017-05-14: 12:00:00 via INTRAVENOUS
  Filled 2017-05-14: qty 1000

## 2017-05-14 MED ORDER — IRON SUCROSE 20 MG/ML IV SOLN
200.0000 mg | Freq: Once | INTRAVENOUS | Status: DC
Start: 2017-05-14 — End: 2017-05-14

## 2017-05-14 MED ORDER — IRON SUCROSE 20 MG/ML IV SOLN
200.0000 mg | Freq: Once | INTRAVENOUS | Status: AC
  Administered 2017-05-14: 200 mg via INTRAVENOUS
  Filled 2017-05-14: qty 10

## 2017-05-14 MED ORDER — FUROSEMIDE 40 MG PO TABS: ORAL_TABLET | ORAL | 0 refills | Status: AC

## 2017-05-14 NOTE — Progress Notes (Signed)
Patient c/o unproductive cough. Has not used her tessalon in a few day. Patient c/o lower extremity 1-2+ pitting edema.

## 2017-05-14 NOTE — Progress Notes (Signed)
[facility] CONSULT NOTE  Patient Care Team: Mickel FuchsWroth, Thomas H, MD as PCP - General (Family Medicine)  CHIEF COMPLAINTS/PURPOSE OF CONSULTATION: Severe Anemia  # June 2018- SEVERE ANEMIA SEC TO IRON DEFICIENCY [4.7- 5.4] s/p PRBC transfusions; CT-C/A/P- NED for cause of anemia.   # June 2018- CHF acute-  [Dr.Callwood]; ? Iron def [s/p eval Dr.Anna- awaiting cardiac evaluation]  # Hx of Lead toxicity [as per pt]   No history exists.     HISTORY OF PRESENTING ILLNESS:  Sonya Baker 66 y.o.  female with a history of iron deficiency anemia of unclear etiology; and also history of acute congestive heart failure-unclear etiology is here for follow-up.  Patient is currently status post IV iron transfusions- patient has noted symptomatically better since the IV iron transfusions.  However the last 2-3 days patient noted to have worsening shortness of breath especially laying on her back at nighttime. She also complains of swelling in the legs. Patient is currently on Lasix 40 mg twice a day.  In the interim patient was evaluated by cardiology; awaiting to get a 2-D echo.  Difficulty sleepING; secondary to cough. Denies any chest pain. Denies any blood in stools. Patient has not had any reactions to IV iron.  ROS: A complete 10 point review of system is done which is negative except mentioned above in history of present illness  MEDICAL HISTORY:  Past Medical History:  Diagnosis Date  . Anemia   . Anxiety   . Depression   . H/O rotator cuff tear   . Hemorrhoid   . History of wrist fracture   . Lead poisoning   . Migraine     SURGICAL HISTORY: bowel obstruction [almost 10 feet intestine]; c-section.  No past surgical history on file.  SOCIAL HISTORY: Social History   Social History  . Marital status: Single    Spouse name: N/A  . Number of children: N/A  . Years of education: N/A   Occupational History  . Not on file.   Social History Main Topics  . Smoking  status: Never Smoker  . Smokeless tobacco: Never Used  . Alcohol use No  . Drug use: No  . Sexual activity: No   Other Topics Concern  . Not on file   Social History Narrative  . No narrative on file    FAMILY HISTORY: Family History  Problem Relation Age of Onset  . Heart failure Father     ALLERGIES:  is allergic to acetaminophen.  MEDICATIONS:  Current Outpatient Prescriptions  Medication Sig Dispense Refill  . ferrous sulfate 325 (65 FE) MG tablet Take 1 tablet (325 mg total) by mouth daily. 30 tablet 3  . furosemide (LASIX) 40 MG tablet 1 pill every 8 hours x 7 days; and then 1 pill every 12 hours 80 tablet 0  . Multiple Vitamin (MULTIVITAMIN) tablet Take 1 tablet by mouth daily.    . potassium chloride SA (K-DUR,KLOR-CON) 20 MEQ tablet Take 2 tablets (40 mEq total) by mouth 2 (two) times daily. 2 pill twice a day. 30 tablet 3  . benzonatate (TESSALON) 100 MG capsule Take by mouth 3 (three) times daily.     No current facility-administered medications for this visit.       Marland Kitchen.  PHYSICAL EXAMINATION: ECOG PERFORMANCE STATUS: 1 - Symptomatic but completely ambulatory  Vitals:   05/14/17 1027  BP: 137/82  Pulse: 96  Resp: 20  Temp: 97.8 F (36.6 C)  SpO2: 98%   Filed  Weights   05/14/17 1027  Weight: 126 lb (57.2 kg)    GENERAL: Well-nourished well-developed; Alert, no distress and comfortable.   Alone.  EYES: no pallor or icterus OROPHARYNX: no thrush or ulceration; Positive for dentures. NECK: supple, no masses felt; positive for JVD. LYMPH:  no palpable lymphadenopathy in the cervical, axillary or inguinal regions LUNGS: clear to auscultation and Basilar crackles noted. No wheezing  HEART/CVS: regular rate & rhythm and no murmurs; 2 plus lower extremity edema ABDOMEN: abdomen soft, non-tender and normal bowel sounds Musculoskeletal:no cyanosis of digits and no clubbing  PSYCH: alert & oriented x 3 with fluent speech NEURO: no focal motor/sensory  deficits SKIN:  no rashes or significant lesions  LABORATORY DATA:  I have reviewed the data as listed Lab Results  Component Value Date   WBC 5.4 05/14/2017   HGB 12.3 05/14/2017   HCT 37.9 05/14/2017   MCV 77.8 (L) 05/14/2017   PLT 248 05/14/2017    Recent Labs  03/27/17 0835 03/28/17 0510 04/09/17 1126  NA 136 137 136  K 3.6 3.6 4.3  CL 109 108 106  CO2 22 24 23   GLUCOSE 90 88 107*  BUN 14 17 25*  CREATININE 0.67 0.84 0.90  CALCIUM 8.6* 8.5* 8.9  GFRNONAA >60 >60 >60  GFRAA >60 >60 >60  PROT 6.6  --   --   ALBUMIN 3.5  --   --   AST 32  --   --   ALT 19  --   --   ALKPHOS 73  --   --   BILITOT 0.5  --   --     RADIOGRAPHIC STUDIES: I have personally reviewed the radiological images as listed and agreed with the findings in the report. No results found.  ASSESSMENT & PLAN:   Iron deficiency anemia due to chronic blood loss # Severe iron deficiency anemia- [at presentation 4.5] status post IV Venofer. hemoglobin- today is 12.3 Still microcytic- MCV 73. Proceed with IV Venofer today.  # ? IDA- Awaiting GI evaluation- patient will likely need endoscopies. however awaiting cardiac evaluation/clearance.   # Acute CHF [july 2018- BNP >3000]- s/p card eval; however given the worsening difficulty breathing  increase lasix 3 three times day x7- days; then Go back to twice  A day. Check daily weight; titrate Lasix to her baseline weight. If not improved recommend follow-up with cardiology. New prescription for Lasix was given.   # follow up in 4 week/possible IV venofer; labs/cbc/bmp/ BNP.   All questions were answered. The patient knows to call the clinic with any problems, questions or concerns.   Earna Coder, MD 05/14/2017 9:37 PM

## 2017-05-14 NOTE — Assessment & Plan Note (Addendum)
#   Severe iron deficiency anemia- [at presentation 4.5] status post IV Venofer. hemoglobin- today is 12.3 Still microcytic- MCV 73. Proceed with IV Venofer today.  # ? IDA- Awaiting GI evaluation- patient will likely need endoscopies. however awaiting cardiac evaluation/clearance.   # Acute CHF [july 2018- BNP >3000]- s/p card eval; however given the worsening difficulty breathing  increase lasix 3 three times day x7- days; then Go back to twice  A day. Check daily weight; titrate Lasix to her baseline weight. If not improved recommend follow-up with cardiology. New prescription for Lasix was given.   # follow up in 4 week/possible IV venofer; labs/cbc/bmp/ BNP.

## 2017-06-11 ENCOUNTER — Ambulatory Visit: Payer: Medicare Other | Admitting: Internal Medicine

## 2017-06-11 ENCOUNTER — Other Ambulatory Visit: Payer: Medicare Other

## 2017-06-11 ENCOUNTER — Ambulatory Visit: Payer: Medicare Other

## 2017-06-13 ENCOUNTER — Other Ambulatory Visit: Payer: Medicare Other

## 2017-09-22 IMAGING — CR DG CHEST 2V
2 series · 2 of 2 positions shown · non-contrast
Comparison: CT scan of March 27, 2017.

CLINICAL DATA: Productive cough, shortness of breath.

EXAM:
CHEST  2 VIEW

[chest pa]
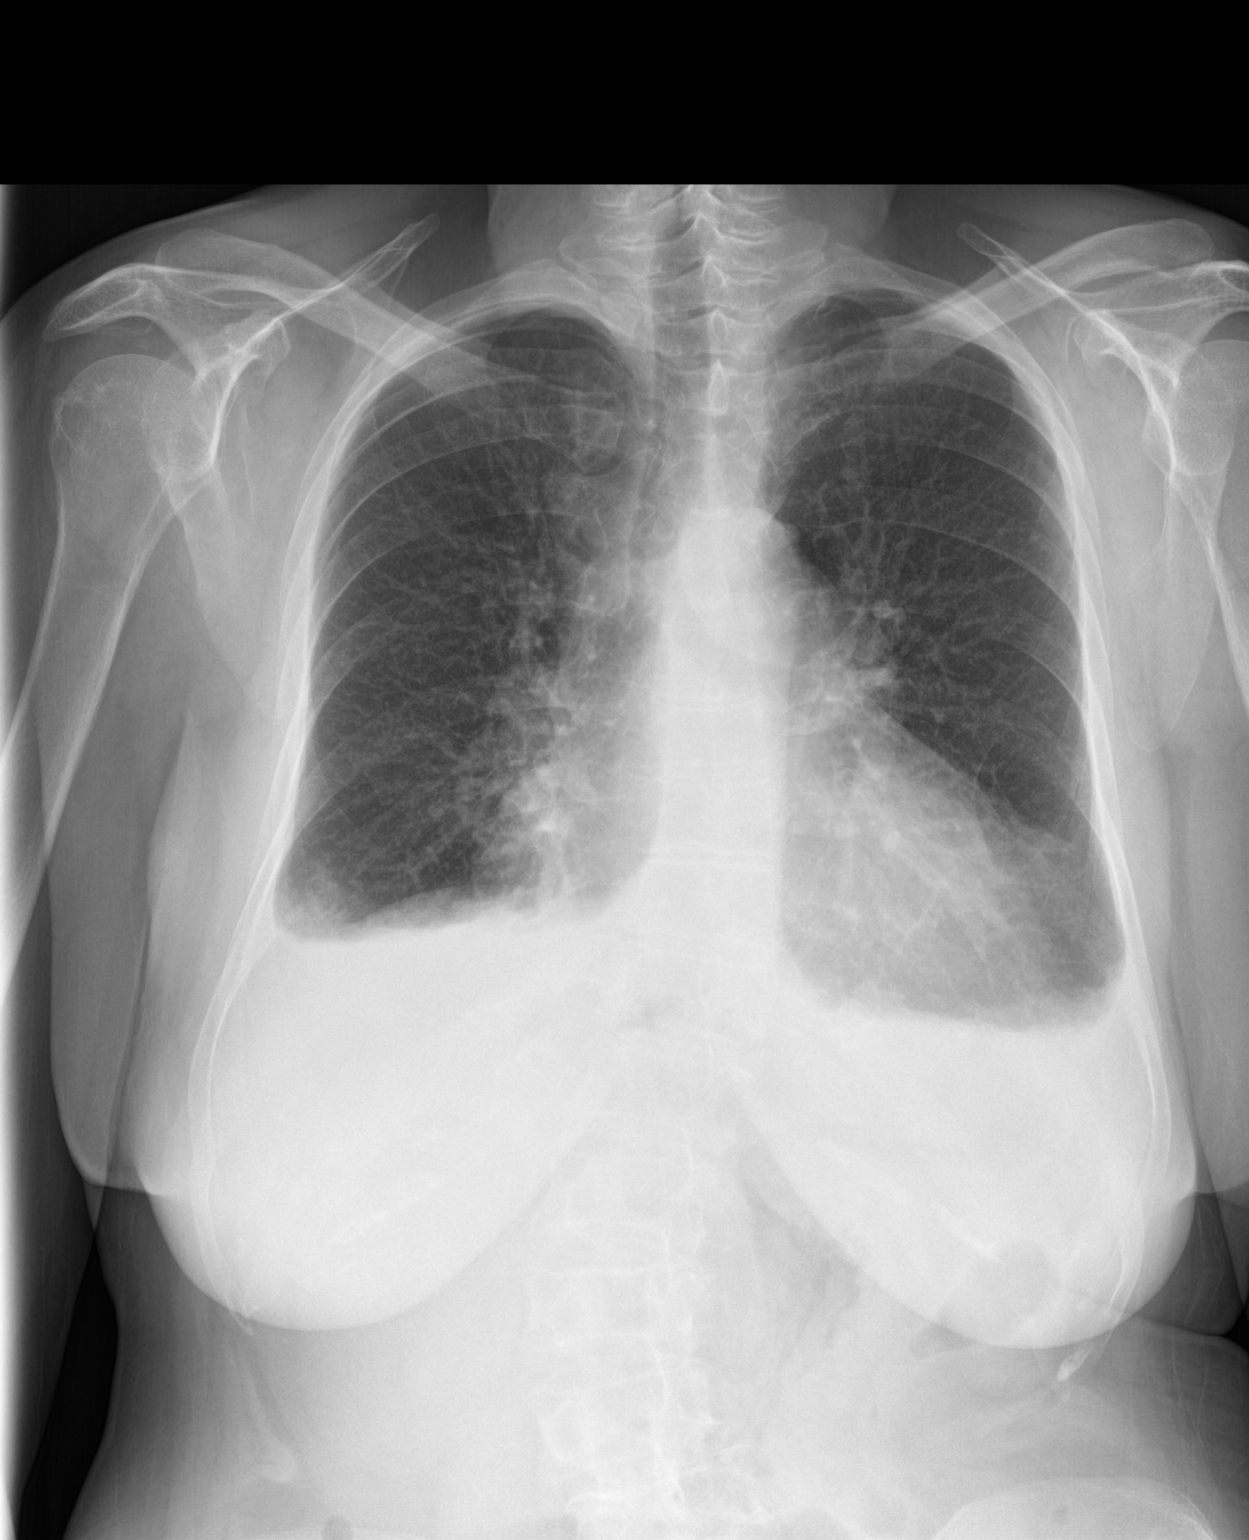

[chest lat]
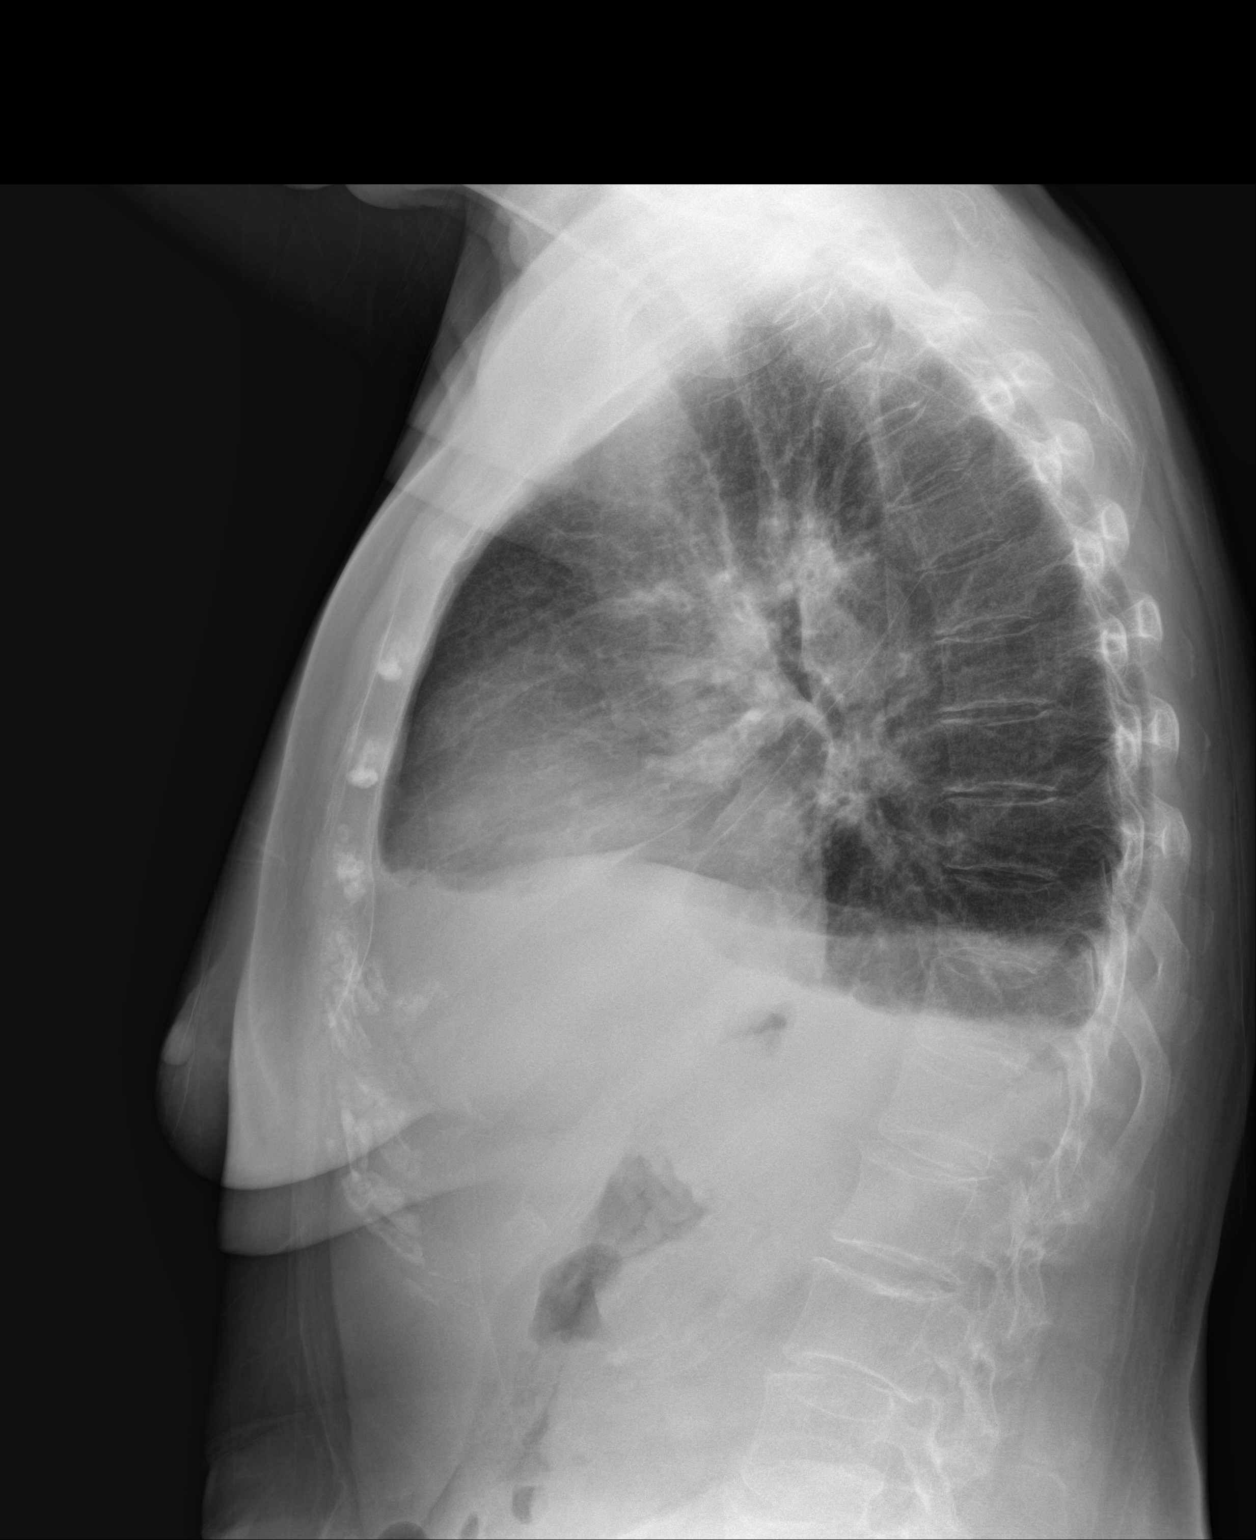

[2 of 2 positions shown; findings below may reference images not displayed]

FINDINGS: Cardiomediastinal silhouette is borderline in size. Mild central
pulmonary vascular congestion is noted. No pneumothorax is noted.
Mild bilateral pleural effusions are noted with associated
atelectasis. Bony thorax is unremarkable.
IMPRESSION: Mild central pulmonary vascular congestion. Mild bilateral pleural
effusions with associated bibasilar subsegmental atelectasis.
